# Patient Record
Sex: Male | Born: 1960 | Race: White | Hispanic: No | Marital: Single | Smoking: Never smoker
Health system: Southern US, Community
[De-identification: ages and names within clinical notes are randomized; demographics above are authoritative.]

## PROBLEM LIST (undated history)

## (undated) DIAGNOSIS — I10 Essential (primary) hypertension: Secondary | ICD-10-CM

---

## 2000-02-02 ENCOUNTER — Encounter: Payer: Self-pay | Admitting: Emergency Medicine

## 2000-02-02 ENCOUNTER — Emergency Department (HOSPITAL_COMMUNITY): Admission: EM | Admit: 2000-02-02 | Discharge: 2000-02-02 | Payer: Self-pay | Admitting: Emergency Medicine

## 2006-11-21 ENCOUNTER — Ambulatory Visit: Payer: Self-pay | Admitting: Internal Medicine

## 2013-12-25 ENCOUNTER — Ambulatory Visit (INDEPENDENT_AMBULATORY_CARE_PROVIDER_SITE_OTHER): Payer: Self-pay | Admitting: Podiatry

## 2013-12-25 ENCOUNTER — Ambulatory Visit: Payer: Self-pay | Admitting: Podiatry

## 2013-12-25 DIAGNOSIS — B351 Tinea unguium: Secondary | ICD-10-CM

## 2013-12-25 NOTE — Progress Notes (Signed)
Subjective:     Patient ID: Shane Rojas, male   DOB: 09-09-1961, 53 y.o.   MRN: [id number]  HPI patient states the nails still back and in his right big 1 has grown abnormally   Review of Systems     Objective:   Physical Exam Neurovascular status intact with thickness of the fifth nails bilateral and the right hallux medial border moving in a dorsal direction    Assessment:     Probable damage to the right hallux nail with mycosis of fifth nails    Plan:     Laser the fifth nails and debridement of the right hallux with discussion of removal of the corner if it does not get better

## 2015-01-09 ENCOUNTER — Other Ambulatory Visit: Payer: Self-pay | Admitting: Family Medicine

## 2015-01-09 ENCOUNTER — Ambulatory Visit
Admission: RE | Admit: 2015-01-09 | Discharge: 2015-01-09 | Disposition: A | Discharge status: Home / Self Care | Payer: 59 | Source: Ambulatory Visit | Attending: Family Medicine | Admitting: Family Medicine

## 2015-01-09 DIAGNOSIS — M25562 Pain in left knee: Secondary | ICD-10-CM

## 2015-01-14 ENCOUNTER — Ambulatory Visit: Payer: Self-pay | Admitting: Podiatry

## 2015-01-21 ENCOUNTER — Ambulatory Visit (INDEPENDENT_AMBULATORY_CARE_PROVIDER_SITE_OTHER): Payer: 59 | Admitting: Podiatry

## 2015-01-21 ENCOUNTER — Encounter: Payer: Self-pay | Admitting: Podiatry

## 2015-01-21 ENCOUNTER — Ambulatory Visit (INDEPENDENT_AMBULATORY_CARE_PROVIDER_SITE_OTHER): Payer: 59

## 2015-01-21 VITALS — BP 166/102 | HR 80 | Resp 14

## 2015-01-21 DIAGNOSIS — M79674 Pain in right toe(s): Secondary | ICD-10-CM

## 2015-01-21 DIAGNOSIS — M2041 Other hammer toe(s) (acquired), right foot: Secondary | ICD-10-CM

## 2015-01-21 NOTE — Progress Notes (Signed)
   Subjective:    Patient ID: Shane PollackJeffrey Rojas, male    DOB: [birth year], 54 y.o.   MRN: [id number]  HPI  Patient presents today with  right 2nd toe is red ,painful and swollen". It started 6 months ago, and has remain the same. He has been treating with apple cider soaks, but nothing works"   Review of Systems  All other systems reviewed and are negative.      Objective:   Physical Exam        Assessment & Plan:

## 2015-01-21 NOTE — Patient Instructions (Signed)
Pre-Operative Instructions  Congratulations, you have decided to take an important step to improving your quality of life.  You can be assured that the doctors of [facility] will be with you every step of the way.  1. Plan to be at the surgery center/hospital at least 1 (one) hour prior to your scheduled time unless otherwise directed by the surgical center/hospital staff.  You must have a responsible adult accompany you, remain during the surgery and drive you home.  Make sure you have directions to the surgical center/hospital and know how to get there on time. 2. For hospital based surgery you will need to obtain a history and physical form from your family physician within 1 month prior to the date of surgery- we will give you a form for you primary physician.  3. We make every effort to accommodate the date you request for surgery.  There are however, times where surgery dates or times have to be moved.  We will contact you as soon as possible if a change in schedule is required.   4. No Aspirin/Ibuprofen for one week before surgery.  If you are on aspirin, any non-steroidal anti-inflammatory medications (Mobic, Aleve, Ibuprofen) you should stop taking it 7 days prior to your surgery.  You make take Tylenol  For pain prior to surgery.  5. Medications- If you are taking daily heart and blood pressure medications, seizure, reflux, allergy, asthma, anxiety, pain or diabetes medications, make sure the surgery center/hospital is aware before the day of surgery so they may notify you which medications to take or avoid the day of surgery. 6. No food or drink after midnight the night before surgery unless directed otherwise by surgical center/hospital staff. 7. No alcoholic beverages 24 hours prior to surgery.  No smoking 24 hours prior to or 24 hours after surgery. 8. Wear loose pants or shorts- loose enough to fit over bandages, boots, and casts. 9. No slip on shoes, sneakers are best. 10. Bring  your boot with you to the surgery center/hospital.  Also bring crutches or a walker if your physician has prescribed it for you.  If you do not have this equipment, it will be provided for you after surgery. 11. If you have not been contracted by the surgery center/hospital by the day before your surgery, call to confirm the date and time of your surgery. 12. Leave-time from work may vary depending on the type of surgery you have.  Appropriate arrangements should be made prior to surgery with your employer. 13. Prescriptions will be provided immediately following surgery by your doctor.  Have these filled as soon as possible after surgery and take the medication as directed. 14. Remove nail polish on the operative foot. 15. Wash the night before surgery.  The night before surgery wash the foot and leg well with the antibacterial soap provided and water paying special attention to beneath the toenails and in between the toes.  Rinse thoroughly with water and dry well with a towel.  Perform this wash unless told not to do so by your physician.  Enclosed: 1 Ice pack (please put in freezer the night before surgery)   1 Hibiclens skin cleaner   Pre-op Instructions  If you have any questions regarding the instructions, do not hesitate to call our office.  [place]: [address] [place], [place] [address] [phone]  [place]: [address] [place], [place] [address] [phone]  [place]: [address]  [place], [place] [address] [phone]  Dr. Richard   Tuchman DPM, Dr. Norman Regal DPM Dr. Richard Sikora DPM, Dr. M. Todd Hyatt DPM, Dr. Kathryn Egerton DPM 

## 2015-01-23 NOTE — Progress Notes (Signed)
Subjective:     Patient ID: Shane Rojas, male   DOB: [birth year], 54 y.o.   MRN: [id number]  HPI patient presents stating my second toe right has been bother me for at least 6 months. 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 I did discuss treatment options and at this point it would probably be best to go ahead and do a small distal arthroplasty and do his best we can to remove the cyst even though it could possibly come back again in the future area I explained this versus trying to drain it which he has done numerous times over 6 months and he has opted to have it fixed. I allowed him to read a consent form for correction explaining all possible alternative treatments and complications listed and the fact that recovery and swelling of the toe can last for at least 6 months with this procedure and patient signed consent form understanding what we discussed. He will call to schedule his surgery depending on his schedule over the next few weeks and is encouraged to call with other questions

## 2015-02-02 ENCOUNTER — Telehealth: Payer: Self-pay | Admitting: *Deleted

## 2015-02-02 ENCOUNTER — Encounter: Payer: Self-pay | Admitting: Sports Medicine

## 2015-02-02 ENCOUNTER — Ambulatory Visit (INDEPENDENT_AMBULATORY_CARE_PROVIDER_SITE_OTHER): Payer: 59 | Admitting: Sports Medicine

## 2015-02-02 VITALS — BP 140/98 | HR 67 | Ht 73.0 in | Wt 200.0 lb

## 2015-02-02 DIAGNOSIS — M25562 Pain in left knee: Secondary | ICD-10-CM | POA: Diagnosis not present

## 2015-02-02 NOTE — Progress Notes (Signed)
   Subjective:    Patient ID: Shane PollackJeffrey Rojas, male    DOB: July 13, 1961, 54 y.o.   MRN: [id number]  HPI Patient is a 54 yo male presenting for evaluation of left knee pain. Gradual onset over the past 6 months. No injury. No prior surgeries. Pain is mostly in the anterior and lateral areas. There is mild intermittent swelling. Is an achey pain, that intermittent worsens. No popping, locking, or catching. Occasionally will feel as though it will give out. Was a runner for 35 years, though stopped running 9 years ago. Skier in the past. Currently swims and does yoga. Recently started tumeric and this has helped. Also uses makers mark and marijuana in the evenings for the pain. He is not interested in injections, other medications, or surgery. He would be interested in PT. Had XR recently that revealed medial compartment severe OA.   Review of Systems     Objective:   Physical Exam  Constitutional: He appears well-developed and well-nourished.  HENT:  Head: Normocephalic and atraumatic.  Musculoskeletal:  Left knee with no swelling or erythema, had mild "different sensation" when palpating the medial and lateral joint lines though no pain, no ligamentous laxity, negative mcmurrays, extremity warm Right knee with no swelling, erythema, or tenderness, no ligamentous laxity, negative mcmurrays, extremity warm 5/5 strength in bilateral quads, hamstrings, plantar and dorsiflexion, sensation to light touch intact in bilateral LE, normal gait, 2+ patellar reflexes       Assessment & Plan:  Left knee medial compartment OA  XR consistent with the above diagnosis. Discussed treatment options with patient including injection (patient declined), other medication management (patient declined), compression sleeve, and PT. Patient opted for compression sleeve and PT at this time. Also advised that he could add cycling, though to limit amount of up hill and out of saddle work he does if he has pain. Can  continue swimming and yoga. Can continue tumeric and advised to not use other OTC NSAIDs while using tumeric. Prescription given for PT. Follow-up prn.   Marikay AlarEric Akeisha Lagerquist, MD [facility] PGY3  Patient seen and evaluated with the resident. I agree with above plan of care. X-rays of his left knee show advanced medial compartmental DJD. We will start with physical therapy and a compression sleeve. Patient understands that we can pursue cortisone injections down the road if his symptoms warrant. Follow-up when necessary.

## 2015-02-02 NOTE — Telephone Encounter (Signed)
"  Dr. Charlsie Merlesegal wanted me to call you to schedule my surgery.  I'm looking for something after 02/25/2015."  He can do it on 03/03/2015 if you like.  "That'll be perfect.  Thank you."  The surgical center will call you a day or two prior to surgery date and give you the exact arrival time.

## 2015-02-20 ENCOUNTER — Telehealth: Payer: Self-pay | Admitting: *Deleted

## 2015-02-20 NOTE — Telephone Encounter (Signed)
I may be able to fix in office. Let me see last chart note

## 2015-02-20 NOTE — Telephone Encounter (Signed)
"  You have helped me by scheduling a surgery for me on 03/03/2015.  I need to know the code to use to see how much it will cost and determine if my insurance will cover it."  I'm returning your call.  The code you need to use is 28285.  The procedure itself costs about $900.  You'll be responsible for 20%.  "I need to cancel for right now because this is just your fee correct?"  Yes, there will be an anesthesia fee as well as a facility fee.  You can make payment arrangements with [place].  "I have a lot of bills that I'm taking care of right now and I can't afford to add on to them.  I'll just hold off until next fall.  I thank you so much for your help.  Do I need to cancel with the surgical center or is that something that you will take care of?"  I'll call the surgical center and cancel it.  "Thank you very much."  I called and canceled it at [facility].

## 2015-02-24 ENCOUNTER — Telehealth: Payer: Self-pay | Admitting: *Deleted

## 2015-02-24 NOTE — Telephone Encounter (Signed)
Per Dr. Charlsie Merles, I called and informed patient that Dr. Charlsie Merles said he can do your surgery here in the office.  That way, you can omit having a facility and anesthesia fee.  "So, I will be responsible for 20% of $900.  That's something to consider.  I appreciate him looking out for me.  Let me check my schedule and some other things and get back to you."

## 2015-03-03 ENCOUNTER — Other Ambulatory Visit: Payer: 59

## 2015-12-22 ENCOUNTER — Telehealth: Payer: Self-pay | Admitting: *Deleted

## 2015-12-22 NOTE — Telephone Encounter (Signed)
Patient called stating that his toenail is not doing any better since having the laser treatment and is requesting an oral antifungal. He does not want to come in for an appointment. Can we call him something in? Please advise.

## 2015-12-23 MED ORDER — TERBINAFINE HCL 250 MG PO TABS: ORAL_TABLET | ORAL | Status: DC

## 2015-12-23 NOTE — Telephone Encounter (Signed)
He can have a pulse lamisil treatment

## 2015-12-23 NOTE — Telephone Encounter (Signed)
Dr. Charlsie Merlesegal ordered 4 month pulses system of Terbinafine 250mg .  Orders to [facility] [address], and instructions to pt.

## 2016-09-26 IMAGING — CR DG KNEE COMPLETE 4+V*L*
4 series · 4 of 4 positions shown · non-contrast
Comparison: None.

CLINICAL DATA: Increasing left knee pain for the past 6 months;
painful range of motion, no known trauma.

EXAM:
LEFT KNEE - COMPLETE 4+ VIEW

[w knee ap left]
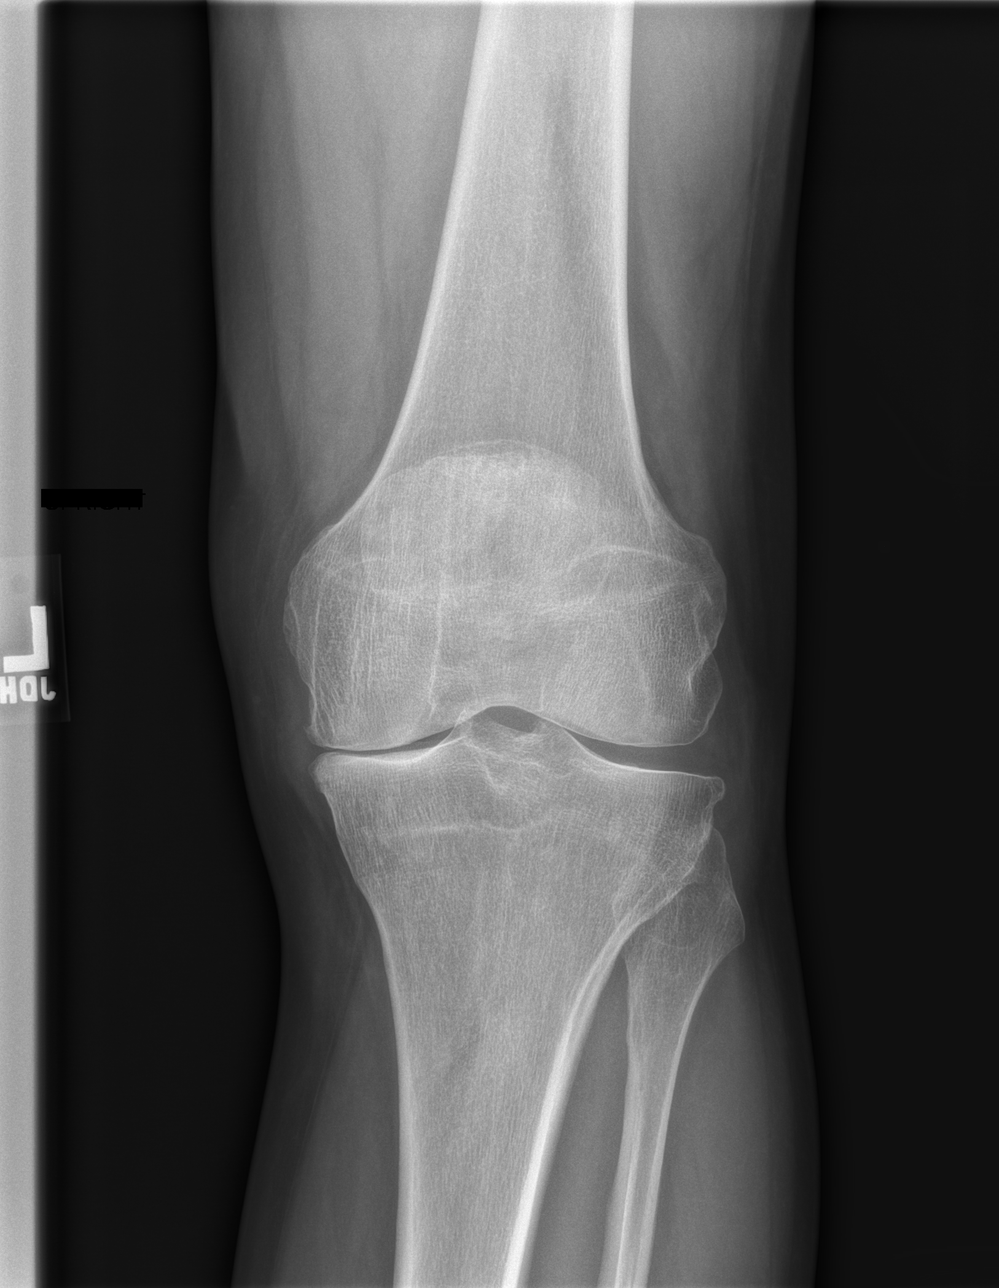

[w knee lat left]
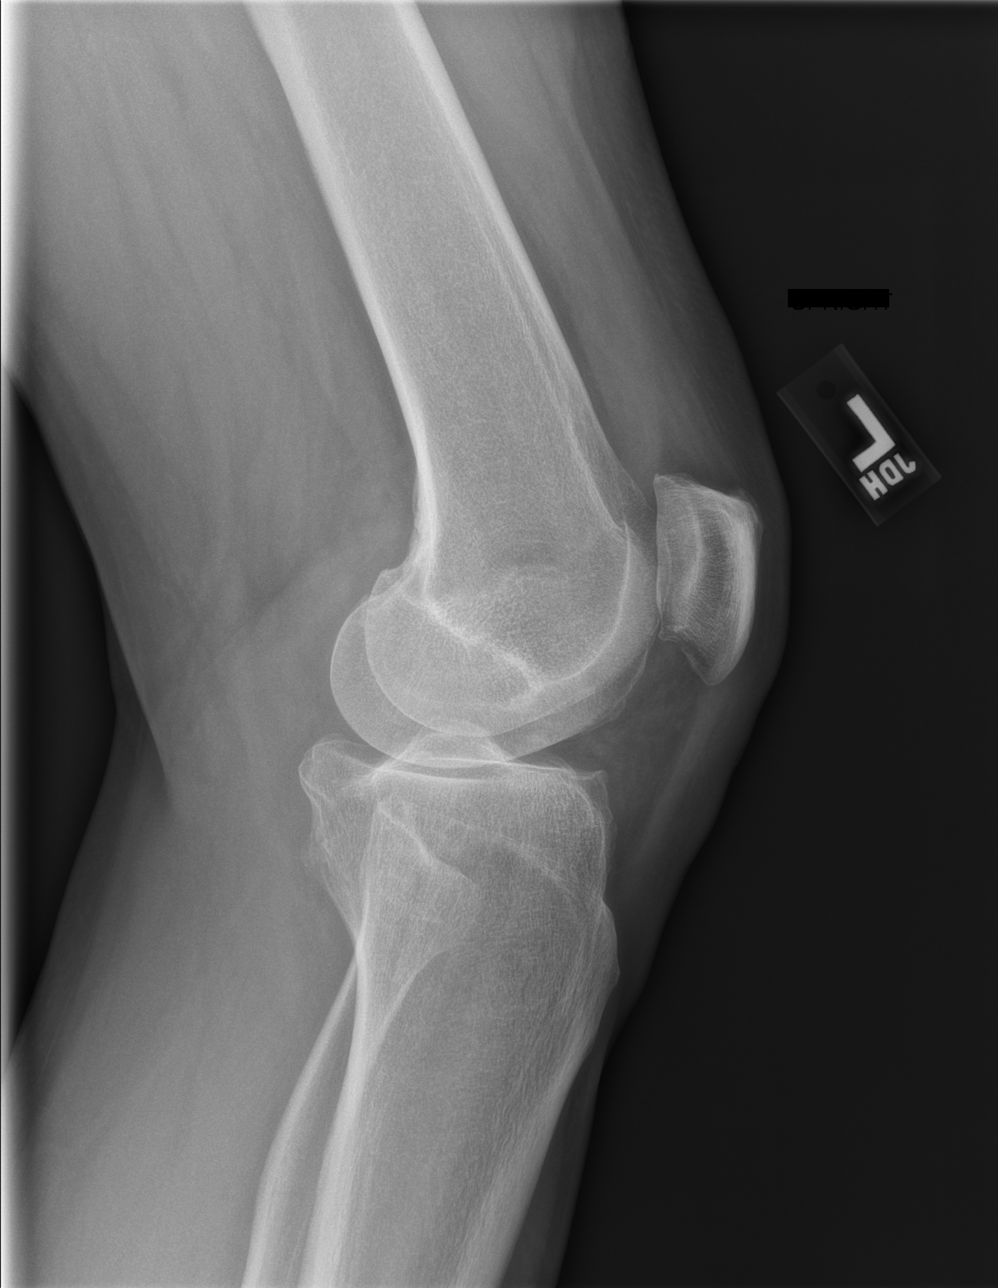

[w knee tunnel ap left]
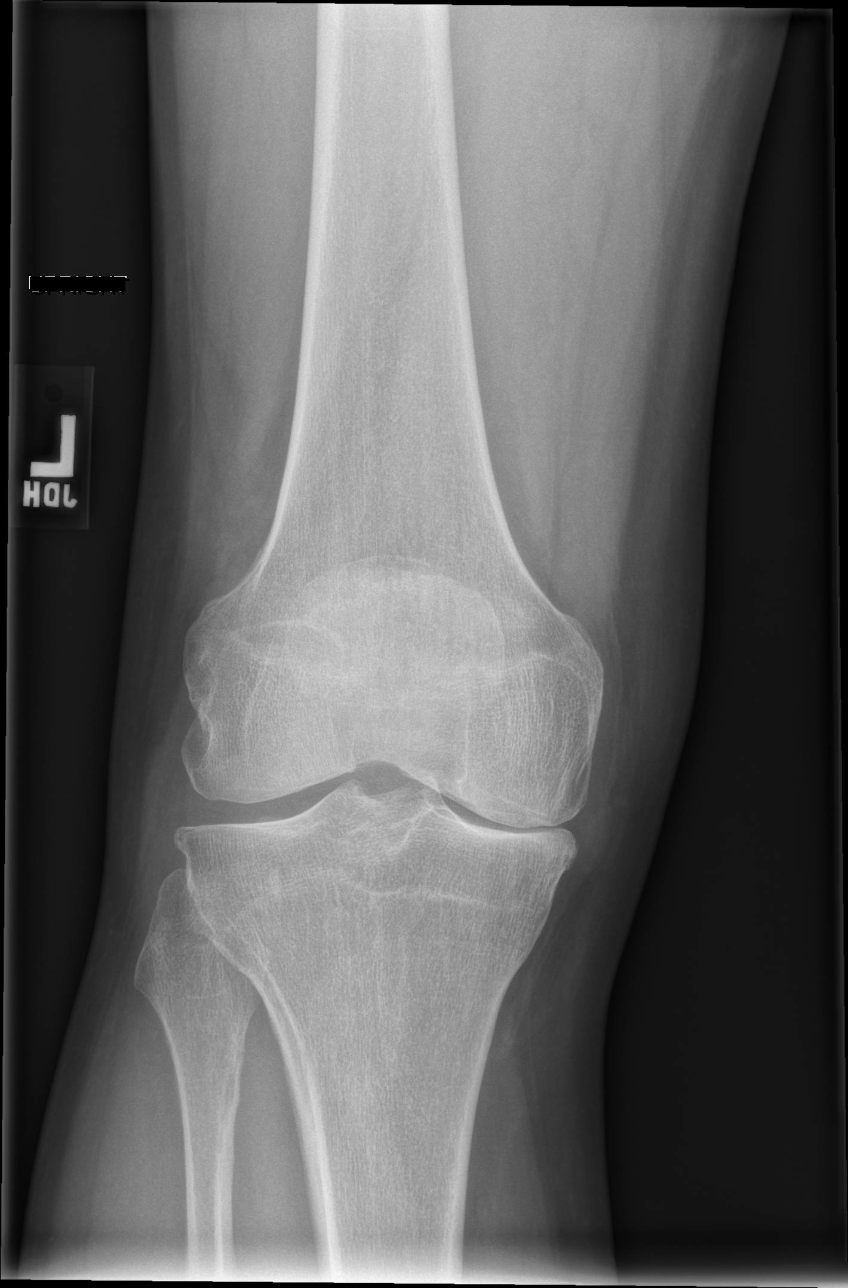

[x knee ap left]
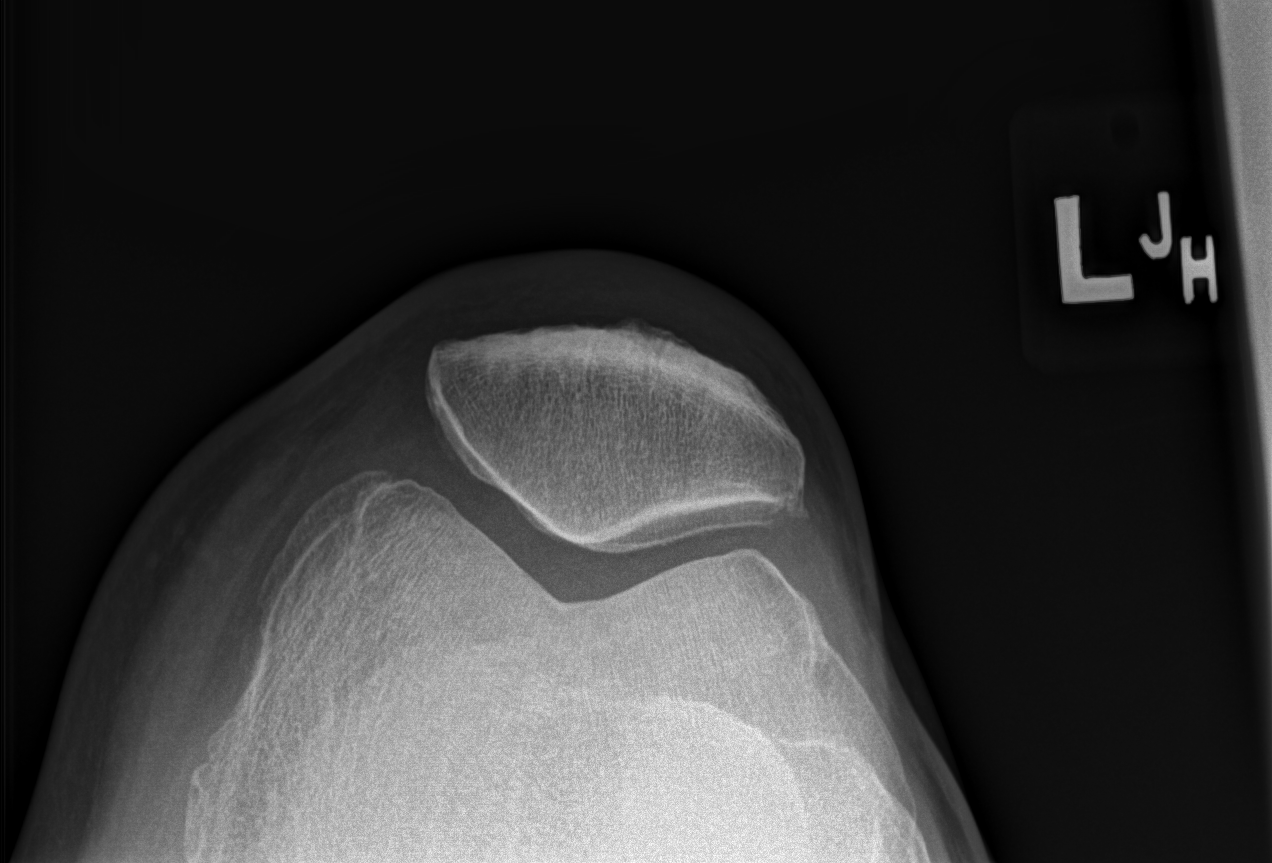

[4 of 4 positions shown; findings below may reference images not displayed]

FINDINGS: The bones of the left knee are adequately mineralized. There is mild
narrowing of the medial joint compartment. There is beaking of the
tibial spines. There tiny spurs from the superior and inferior
articular margins of the patella. There is no joint effusion.
IMPRESSION: There is moderate narrowing of the medial joint compartment and
minimal spurring of the articular surface of the patella consistent
with osteoarthritis. There is no acute bony abnormality.

## 2019-06-19 ENCOUNTER — Other Ambulatory Visit: Payer: Self-pay

## 2019-06-19 ENCOUNTER — Encounter: Payer: Self-pay | Admitting: Podiatry

## 2019-06-19 ENCOUNTER — Ambulatory Visit (INDEPENDENT_AMBULATORY_CARE_PROVIDER_SITE_OTHER): Payer: Self-pay | Admitting: Podiatry

## 2019-06-19 VITALS — BP 156/84

## 2019-06-19 DIAGNOSIS — B351 Tinea unguium: Secondary | ICD-10-CM

## 2019-06-19 MED ORDER — TERBINAFINE HCL 250 MG PO TABS: ORAL_TABLET | ORAL | 1 refills | Status: DC

## 2019-06-23 NOTE — Progress Notes (Signed)
Subjective:   Patient ID: Shane Rojas, male   DOB: 58 y.o.   MRN: [id number]   HPI Patient states his nails are starting to discolored again and he did well when he took the antifungal   ROS      Objective:  Physical Exam  Neurovascular status intact with patient shown to have discoloration hallux nail left over right localized in nature     Assessment:  Localized nail infection with probable trauma also present     Plan:  Reviewed condition discussed treatment options and we will go with anti-fungal oral consisting of pulse pack and I did review laser or other treatments that are possible

## 2020-02-26 ENCOUNTER — Ambulatory Visit: Payer: Self-pay

## 2020-02-26 ENCOUNTER — Encounter: Payer: Self-pay | Admitting: Family Medicine

## 2020-02-26 ENCOUNTER — Other Ambulatory Visit: Payer: Self-pay

## 2020-02-26 ENCOUNTER — Ambulatory Visit: Payer: Self-pay | Admitting: Family Medicine

## 2020-02-26 DIAGNOSIS — M542 Cervicalgia: Secondary | ICD-10-CM

## 2020-02-26 DIAGNOSIS — M79602 Pain in left arm: Secondary | ICD-10-CM

## 2020-02-26 NOTE — Progress Notes (Signed)
Office Visit Note   Patient: Shane Rojas           Date of Birth: 1961-08-16           MRN: [id number] Visit Date: 02/26/2020 Requested by: Tally Joe, MD [phone] [address] [place],  [place] [address] PCP: Tally Joe, MD  Subjective: Chief Complaint  Patient presents with  . Left Shoulder - Pain  . Neck - Pain    HPI: He is here with neck and left shoulder pain. He states that 11 years ago he herniated a cervical disc. He underwent chiropractic treatment and eventually was able to manage his pain without needing surgery. Over the years he has had intermittent flareups of pain. Most recently a couple weeks ago he started hurting again and this time he developed weakness in his arm. He is right-hand dominant. He has difficulty picking things up because of the weakness. He began seeing his chiropractor and is also doing yoga. The chiropractic treatment has helped with his pain but is weakness has persisted. Denies fevers or chills, he gets intermittent numbness in his hand. He is otherwise been in good health. His primary concern is to rule out cancer. He has never had cancer but it is thought has crossed his mind.  He works as an Insurance underwriter.               ROS:   All other systems were reviewed and are negative.  Objective: Vital Signs: There were no vitals taken for this visit.  Physical Exam:  General:  Alert and oriented, in no acute distress. Pulm:  Breathing unlabored. Psy:  Normal mood, congruent affect. Skin: No rash Neck: He has good range of motion today, some pain at the extremes. Spurling's test is negative. He has weakness with deltoid, biceps flexion on the left. Rotator cuff is weak with empty can and external rotation. The weakness in those muscles is 4/5. Internal rotation, triceps, wrist and intrinsic hand strength are all 5/5. He has 2+ biceps, triceps, brachial radialis DTRs bilaterally.  Imaging: XR Cervical Spine 2 or 3  views  Result Date: 02/26/2020 X-ray cervical spine reveal diffuse degenerative disc disease and facet and uncovertebral DJD. I do not see any compression fracture or any signs of neoplasm.  XR Shoulder Left  Result Date: 02/26/2020 X-rays left shoulder reveal mild to moderate AC joint DJD. Type I-2 acromion. Glenohumeral joint looks normal. No soft tissue calcifications. No sign of neoplasm. Lung fields are clear.   Assessment & Plan: 1. 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 -He did not want anything for pain. He plans to continue working with his chiropractor. If after 2 to 3 months his strength is not returning, he will call me and I will order cervical spine MRI scan. Nerve conduction studies would be another consideration prior to MRI.     Procedures: No procedures performed  No notes on file     PMFS History: There are no problems to display for this patient.  History reviewed. No pertinent past medical history.  History reviewed. No pertinent family history.  History reviewed. No pertinent surgical history. Social History   Occupational History  . Not on file  Tobacco Use  . Smoking status: Never Smoker  . Smokeless tobacco: Never Used  Substance and Sexual Activity  . Alcohol use: Yes    Alcohol/week: 0.0 standard drinks    Comment:  daily  . Drug use: Yes    Types: Marijuana  . Sexual activity: Not on file

## 2021-02-17 ENCOUNTER — Telehealth: Payer: Self-pay | Admitting: *Deleted

## 2021-02-17 NOTE — Telephone Encounter (Signed)
Will continue to cause change for approx 6 months after stopping medicine

## 2021-02-17 NOTE — Telephone Encounter (Signed)
Patient is calling to request a refill of terbinafine-250 mg and what will be the next phase for his toes, medication didn't cure it yet. Please advise.

## 2021-02-18 ENCOUNTER — Telehealth: Payer: Self-pay | Admitting: *Deleted

## 2021-02-18 NOTE — Telephone Encounter (Signed)
Returned call to patient, gave information to patient, wanted to scheduled an appointment for a follow-up.Please schedule.

## 2021-02-18 NOTE — Telephone Encounter (Signed)
Error message

## 2021-02-24 ENCOUNTER — Ambulatory Visit (INDEPENDENT_AMBULATORY_CARE_PROVIDER_SITE_OTHER): Payer: Self-pay | Admitting: Podiatry

## 2021-02-24 ENCOUNTER — Other Ambulatory Visit: Payer: Self-pay

## 2021-02-24 DIAGNOSIS — B351 Tinea unguium: Secondary | ICD-10-CM

## 2021-02-24 MED ORDER — TERBINAFINE HCL 250 MG PO TABS: ORAL_TABLET | ORAL | 0 refills | Status: DC

## 2021-02-24 NOTE — Progress Notes (Signed)
Subjective:   Patient ID: Shane Rojas, male   DOB: 60 y.o.   MRN: [id number]   HPI Patient presents concerned about his nail disease of both feet states that he thinks it is gotten worse again and he does not remember whether he has had significant improvement.  States the oral medicine seem to help him   ROS      Objective:  Physical Exam  Neurovascular status intact with discoloration of the hallux and fifth nails bilateral local     Assessment:  Probability for local mycotic nail infection     Plan:  Reviewed condition and recommended oral pulse Lamisil therapy and he is placed on this today and will be seen back for review

## 2022-08-25 ENCOUNTER — Other Ambulatory Visit: Payer: Self-pay

## 2022-08-25 ENCOUNTER — Encounter (HOSPITAL_COMMUNITY): Payer: Self-pay

## 2022-08-25 ENCOUNTER — Observation Stay (HOSPITAL_COMMUNITY)
Admission: EM | Admit: 2022-08-25 | Discharge: 2022-08-26 | Disposition: A | Discharge status: Home / Self Care | Payer: Self-pay | Attending: Internal Medicine | Admitting: Internal Medicine

## 2022-08-25 ENCOUNTER — Emergency Department (HOSPITAL_COMMUNITY): Payer: Self-pay

## 2022-08-25 DIAGNOSIS — Z79899 Other long term (current) drug therapy: Secondary | ICD-10-CM | POA: Insufficient documentation

## 2022-08-25 DIAGNOSIS — I351 Nonrheumatic aortic (valve) insufficiency: Secondary | ICD-10-CM | POA: Insufficient documentation

## 2022-08-25 DIAGNOSIS — R001 Bradycardia, unspecified: Principal | ICD-10-CM | POA: Diagnosis present

## 2022-08-25 DIAGNOSIS — R002 Palpitations: Secondary | ICD-10-CM | POA: Insufficient documentation

## 2022-08-25 DIAGNOSIS — I160 Hypertensive urgency: Secondary | ICD-10-CM

## 2022-08-25 DIAGNOSIS — R0789 Other chest pain: Secondary | ICD-10-CM | POA: Insufficient documentation

## 2022-08-25 DIAGNOSIS — I10 Essential (primary) hypertension: Secondary | ICD-10-CM | POA: Insufficient documentation

## 2022-08-25 HISTORY — DX: Essential (primary) hypertension: I10

## 2022-08-25 LAB — CBC
HCT: 40.5 % (ref 39.0–52.0)
HCT: 36.6 % — ABNORMAL LOW (ref 39.0–52.0)
Hemoglobin: 14.2 g/dL (ref 13.0–17.0)
Hemoglobin: 12.7 g/dL — ABNORMAL LOW (ref 13.0–17.0)
MCH: 31.3 pg (ref 26.0–34.0)
MCH: 31.7 pg (ref 26.0–34.0)
MCHC: 35.1 g/dL (ref 30.0–36.0)
MCHC: 34.7 g/dL (ref 30.0–36.0)
MCV: 89.4 fL (ref 80.0–100.0)
MCV: 91.3 fL (ref 80.0–100.0)
Platelets: 150 10*3/uL (ref 150–400)
Platelets: 131 10*3/uL — ABNORMAL LOW (ref 150–400)
RBC: 4.53 MIL/uL (ref 4.22–5.81)
RBC: 4.01 MIL/uL — ABNORMAL LOW (ref 4.22–5.81)
RDW: 12.4 % (ref 11.5–15.5)
RDW: 12.4 % (ref 11.5–15.5)
WBC: 5.8 10*3/uL (ref 4.0–10.5)
WBC: 5.7 10*3/uL (ref 4.0–10.5)
nRBC: 0 % (ref 0.0–0.2)
nRBC: 0 % (ref 0.0–0.2)

## 2022-08-25 LAB — BASIC METABOLIC PANEL
Anion gap: 10 (ref 5–15)
BUN: 12 mg/dL (ref 8–23)
CO2: 22 mmol/L (ref 22–32)
Calcium: 9 mg/dL (ref 8.9–10.3)
Chloride: 103 mmol/L (ref 98–111)
Creatinine, Ser: 0.95 mg/dL (ref 0.61–1.24)
GFR, Estimated: >60 mL/min (ref >60)
Glucose, Bld: 109 mg/dL — ABNORMAL HIGH (ref 70–99)
Potassium: 3.5 mmol/L (ref 3.5–5.1)
Sodium: 135 mmol/L (ref 135–145)

## 2022-08-25 LAB — MAGNESIUM: Magnesium: 2.1 mg/dL (ref 1.7–2.4)

## 2022-08-25 LAB — CREATININE, SERUM
Creatinine, Ser: 0.81 mg/dL (ref 0.61–1.24)
GFR, Estimated: >60 mL/min (ref >60)

## 2022-08-25 LAB — T4, FREE: Free T4: 0.96 ng/dL (ref 0.61–1.12)

## 2022-08-25 LAB — TROPONIN I (HIGH SENSITIVITY)
Troponin I (High Sensitivity): 10 ng/L (ref <18)
Troponin I (High Sensitivity): 14 ng/L (ref <18)

## 2022-08-25 LAB — CBG MONITORING, ED: Glucose-Capillary: 98 mg/dL (ref 70–99)

## 2022-08-25 LAB — TSH: TSH: 1.326 u[IU]/mL (ref 0.350–4.500)

## 2022-08-25 MED ORDER — SODIUM CHLORIDE 0.9% FLUSH: 3.0000 mL | INTRAVENOUS | Status: DC | PRN

## 2022-08-25 MED ORDER — ASPIRIN 81 MG PO CHEW
324.0000 mg | CHEWABLE_TABLET | Freq: Once | ORAL | Status: AC
  Administered 2022-08-25: 324 mg via ORAL
  Filled 2022-08-25: qty 4

## 2022-08-25 MED ORDER — SODIUM CHLORIDE 0.9 % IV SOLN: 250.0000 mL | INTRAVENOUS | Status: DC | PRN

## 2022-08-25 MED ORDER — SODIUM CHLORIDE 0.9% FLUSH
3.0000 mL | Freq: Two times a day (BID) | INTRAVENOUS | Status: DC
  Administered 2022-08-25 – 2022-08-26 (×2): 3 mL via INTRAVENOUS

## 2022-08-25 MED ORDER — NITROGLYCERIN 0.4 MG SL SUBL: 0.4000 mg | SUBLINGUAL_TABLET | SUBLINGUAL | Status: DC | PRN

## 2022-08-25 MED ORDER — ASPIRIN 81 MG PO TBEC
81.0000 mg | DELAYED_RELEASE_TABLET | Freq: Every day | ORAL | Status: DC
  Administered 2022-08-26: 81 mg via ORAL
  Filled 2022-08-25: qty 1

## 2022-08-25 MED ORDER — LISINOPRIL 10 MG PO TABS
10.0000 mg | ORAL_TABLET | Freq: Every day | ORAL | Status: DC
  Administered 2022-08-25 – 2022-08-26 (×2): 10 mg via ORAL
  Filled 2022-08-25 (×2): qty 1

## 2022-08-25 MED ORDER — HEPARIN SODIUM (PORCINE) 5000 UNIT/ML IJ SOLN
5000.0000 [IU] | Freq: Three times a day (TID) | INTRAMUSCULAR | Status: DC
  Filled 2022-08-25: qty 1

## 2022-08-25 MED ORDER — POTASSIUM CHLORIDE CRYS ER 20 MEQ PO TBCR
40.0000 meq | EXTENDED_RELEASE_TABLET | Freq: Once | ORAL | Status: AC
  Administered 2022-08-25: 40 meq via ORAL
  Filled 2022-08-25: qty 2

## 2022-08-25 MED ORDER — ONDANSETRON HCL 4 MG/2ML IJ SOLN: 4.0000 mg | Freq: Four times a day (QID) | INTRAMUSCULAR | Status: DC | PRN

## 2022-08-25 MED ORDER — ACETAMINOPHEN 325 MG PO TABS: 650.0000 mg | ORAL_TABLET | ORAL | Status: DC | PRN

## 2022-08-25 NOTE — ED Provider Notes (Signed)
[facility] EMERGENCY DEPARTMENT Provider Note   CSN: [id number] Arrival date & time: 08/25/22  1308     History  Chief Complaint  Patient presents with   Chest Pain    Shane Rojas is a 61 y.o. male with past medical history significant for hypertension presenting via EMS from urgent care for chest pain and bradycardia.  Patient states that over the past few weeks he has had an increased sensation of his heart and heart beat.  He also endorses some intermittent chest heaviness.  He states today he had a "strange sensation" in his left arm which prompted him to go to urgent care.  He came to [place] from urgent care.  Unclear why urgent care called EMS.  Per EMS on their arrival, patient was hypertensive with heart rate in the 70s.  They stated that while they were in rounds, patient became bradycardic to the 30s.  Blood pressure was in the 160s systolic.  Patient endorsed chest discomfort during this time.  They gave him atropine.  They report improvement in heart rate to 50s.  They began pacing him.  He had no change in his blood pressure throughout.  He did not lose consciousness.  Patient states that he has been trying to manage his blood pressure with diet, medication, and has not been taking his lisinopril.  He denies nausea, vomiting, fevers, cough, congestion.     Home Medications Prior to Admission medications   Medication Sig Start Date End Date Taking? Authorizing Provider  lisinopril (ZESTRIL) 5 MG tablet Take 5 mg by mouth daily.   Yes [provider]  terbinafine (LAMISIL) 250 MG tablet One tablet daily for 7 days, off 3 weeks as one pulse, repeat pulse 3 more times. Patient not taking: Reported on 08/25/2022 12/23/15   Lenn Sink, DPM  terbinafine (LAMISIL) 250 MG tablet Please take one a day x 7days, repeat every 4 weeks x 4 months 06/19/19   Lenn Sink, DPM  terbinafine (LAMISIL) 250 MG tablet Please take one a day x 7days, repeat every 4  weeks x 4 months Patient not taking: Reported on 08/25/2022 02/24/21   Lenn Sink, DPM      Allergies    Patient has no known allergies.    Review of Systems   Review of Systems  Cardiovascular:  Positive for chest pain.    Physical Exam Updated Vital Signs BP (!) 170/94   Pulse 66   Temp 98.6 F (37 C)   Resp 15   Ht 6\' 2"  (1.88 m)   Wt 88.5 kg   SpO2 100%   BMI 25.04 kg/m  Physical Exam Constitutional:      General: He is not in acute distress.    Appearance: He is not ill-appearing or diaphoretic.  HENT:     Head: Normocephalic.  Cardiovascular:     Rate and Rhythm: Normal rate and regular rhythm.     Heart sounds: Normal heart sounds.  Pulmonary:     Effort: Pulmonary effort is normal.     Breath sounds: Normal breath sounds.  Abdominal:     Palpations: Abdomen is soft.     Tenderness: There is no abdominal tenderness. There is no guarding or rebound.  Musculoskeletal:     Right lower leg: No tenderness. No edema.     Left lower leg: No tenderness. No edema.  Neurological:     Mental Status: He is alert and oriented to person, place, and time.  Psychiatric:        Mood and Affect: Mood is anxious.        Behavior: Behavior is not agitated.     ED Results / Procedures / Treatments   Labs (all labs ordered are listed, but only abnormal results are displayed) Labs Reviewed  BASIC METABOLIC PANEL - Abnormal; Notable for the following components:      Result Value   Glucose, Bld 109 (*)    All other components within normal limits  CBC  MAGNESIUM  TSH  T4, FREE  CBG MONITORING, ED  TROPONIN I (HIGH SENSITIVITY)  TROPONIN I (HIGH SENSITIVITY)    EKG EKG Interpretation  Date/Time:  Thursday August 25 2022 13:16:50 EST Ventricular Rate:  71 PR Interval:  174 QRS Duration: 137 QT Interval:  427 QTC Calculation: 464 R Axis:   33 Text Interpretation: Sinus rhythm Right bundle branch block Confirmed by Virgina Norfolk ([id number]) on 08/25/2022  1:18:44 PM  Radiology DG Chest Portable 1 View  Result Date: 08/25/2022 CLINICAL DATA:  Chest pain. EXAM: PORTABLE CHEST 1 VIEW COMPARISON:  None Available. FINDINGS: The heart size and mediastinal contours are within normal limits. Both lungs are clear. The visualized skeletal structures are unremarkable. IMPRESSION: No active disease. Electronically Signed   By: Lupita Raider M.D.   On: 08/25/2022 13:40    Procedures Procedures    Medications Ordered in ED Medications  aspirin chewable tablet 324 mg (324 mg Oral Given 08/25/22 1338)    ED Course/ Medical Decision Making/ A&P                           Medical Decision Making Amount and/or Complexity of Data Reviewed Labs: ordered. Decision-making details documented in ED Course. Radiology: ordered and independent interpretation performed. Decision-making details documented in ED Course. ECG/medicine tests: ordered and independent interpretation performed. Decision-making details documented in ED Course.   On initial evaluation, patient had pacing pads on.  Upon discontinuation of pacing, his heart rate was in the 70s, he continued to be alert, oriented, hypertensive.  He denies chest pain at this time.  He expresses feeling anxious and worried.  Differential at this time includes arrhythmia, heart block, ACS.  Patient placed on the cardiac monitor.    EKG showed sinus rhythm, ventricular rate 71 bpm, with PACs.  No ST elevations, no ST depressions, no acute ischemic signs.  He does have right bundle branch block.  No prior EKG for comparison.  CBC, BMP, troponin, TSH, free T4, magnesium ordered.  Chest x-ray showed no focal consolidation or opacification.  BMP unremarkable.  CBC within normal limits.  Initial troponin 10.  Plan at time of signout to follow-up patient's troponin and continue to observe on telemetry.  Should patient remain in a normal sinus rhythm, have improvement in his symptoms, have normal troponin, TSH,  free T4, I believe he will be stable for discharge with outpatient follow-up with cardiology.        Final Clinical Impression(s) / ED Diagnoses Final diagnoses:  Chest heaviness  Palpitations    Rx / DC Orders ED Discharge Orders     None         Linward Foster, MD 08/25/22 1533    Virgina Norfolk, DO 08/26/22 3709

## 2022-08-25 NOTE — ED Provider Notes (Signed)
Supervised resident visit.  Patient here with palpitations.  Patient has been having a weird feeling in his body being very aware of his heart rate.  He has been trying to use yoga stress reduction to help with blood pressure that is elevated and not been taking his lisinopril.  He denies any active chest pain or shortness of breath.  Denies any alcohol or drug use.  He has a history of high blood pressure that he does not take his lisinopril because he does not like to take medicines.  There is may be some bradycardic episodes with EMS however rhythm strips EMS have provided shows sinus rhythm.  No ischemic changes.  No obvious bradycardias.  He is having some PVCs/PACs.  He was given atropine in route and paced briefly.  But he arrives here with normal vitals.  Heart rate in the 70s.  He has normal sinus rhythm with no ischemic changes on EKG here.  I see what looks like a PAC/PVC.  I suspect that these are what he is feeling as he is mostly describing some intermittent palpitations.  He does not really have cardiac risk factors except for hypertension.  He has no PE risk factors.  He has no infectious symptoms.  Denies any GI symptoms or viral symptoms.  Will check CBC, BMP, troponin, chest x-ray, electrolytes, thyroid studies and magnesium.  Will monitor his heart rhythm while he is here.  He has not had any syncopal episodes or loss of consciousness.  Anticipate discharge to home if workup is unremarkable with him starting his blood pressure medicine and following up with his primary care doctor.  This chart was dictated using voice recognition software.  Despite best efforts to proofread,  errors can occur which can change the documentation meaning.    Virgina Norfolk, DO 08/25/22 1355

## 2022-08-25 NOTE — ED Notes (Signed)
Report received, patient moved to ER 12, assumed care of patient at this time.

## 2022-08-25 NOTE — ED Triage Notes (Signed)
Pt BIB [facility] from urgent care c/o bradycardia and a weird felling in his arm and some CP this morning which is what took him to urgent care. Pt has a hx of HTN. Pt was 70 enroute and then dropped to the 40's and then the 30's . EMS started pacing pt and gave 1mg  of atropine.

## 2022-08-25 NOTE — ED Provider Notes (Signed)
  Physical Exam  BP (!) 170/94   Pulse 66   Temp 98.6 F (37 C)   Resp 15   Ht 6\' 2"  (1.88 m)   Wt 88.5 kg   SpO2 100%   BMI 25.04 kg/m   Physical Exam Vitals and nursing note reviewed.  Constitutional:      General: He is not in acute distress.    Appearance: He is well-developed.  HENT:     Head: Normocephalic and atraumatic.  Eyes:     Conjunctiva/sclera: Conjunctivae normal.  Cardiovascular:     Rate and Rhythm: Normal rate and regular rhythm.     Heart sounds: No murmur heard. Pulmonary:     Effort: Pulmonary effort is normal. No respiratory distress.     Breath sounds: Normal breath sounds.  Abdominal:     Palpations: Abdomen is soft.     Tenderness: There is no abdominal tenderness.  Musculoskeletal:        General: No swelling.     Cervical back: Neck supple.  Skin:    General: Skin is warm and dry.     Capillary Refill: Capillary refill takes less than 2 seconds.  Neurological:     General: No focal deficit present.     Mental Status: He is alert and oriented to person, place, and time. Mental status is at baseline.     Cranial Nerves: No cranial nerve deficit.     Sensory: No sensory deficit.     Motor: No weakness.     Coordination: Coordination normal.  Psychiatric:        Mood and Affect: Mood normal.     Procedures  Procedures  ED Course / MDM    Medical Decision Making Amount and/or Complexity of Data Reviewed Labs: ordered. Radiology: ordered.  Risk OTC drugs. Decision regarding hospitalization.   Patient's presentation is most consistent with acute presentation with potential threat to life or bodily function.   The patient is a 61 year old male with past medical history of uncontrolled hypertension presenting by EMS from urgent care.  The patient states that he has known that he has hypertension for some time.  He endorses several weeks of feeling palpitations.  He went to an urgent care where he was noted to be hypertensive and with  systolic blood pressures in the 220s+.  The urgent care provider wanted the patient to be seen in the emergency department and called an ambulance.  While en route to the hospital the patient stated that he began to feel ill and bradycardia down to a heart rate of 30.  He believes he had an associated loss of consciousness.  The patient was given a dose of atropine and pacing was initiated.  The patient's workup with the previous provider included serial troponins which were negative; T4, mag, blood glucose, BMP, and CBC all of which were unremarkable.  Cardiology was consulted and the patient was admitted for observation.  There were no additional episodes of bradycardia or syncope while in the emergency department.     67, MD 08/25/22 2006    [place], 08/27/22, MD 08/26/22 0001

## 2022-08-25 NOTE — Discharge Instructions (Addendum)
START Lisinopril 10mg  once daily. Follow-up with Cardiology as directed.

## 2022-08-25 NOTE — H&P (Addendum)
Cardiology Admission History and Physical   Patient ID: Shane Rojas MRN: [id number]; DOB: 1960-10-23   Admission date: 08/25/2022  PCP:  Pcp, No   [facility] Providers Cardiologist:  None        Chief Complaint:  weird feeling in chest  Patient Profile:   Shane Rojas is a 61 y.o. male with HTN who is being seen 08/25/2022 for the evaluation of bradycardia, odd feeling in chest.  History of Present Illness:   [name] reports a prior hx of borderline HTN. He previously was suggested to start lisinopril about a year ago but stopped taking it as he was trying to manage this with diet and exercise. 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 He also goes to the gym regularly and does yoga. He has not had any recent angina or DOE. He does note a profound history of vagal reactions to getting blood drawn in the past to the point where he would warn the person drawing his blood that they would need to do so with him lying down otherwise he would pass out. He has not had this happen in several years as he has not sought medical care otherwise due to lack of insurance, aside from the encounter where lisinopril was added. He drinks average 1 beer 3x week, sometimes a second drink on these occasions. + THC infrequently, no illicit drug use.  About 3 weeks ago he began to feel an unusual sensation in his chest described as though his heart was occupying more space than it typically did. He also would intermittently feel an awareness of his left arm. No overt chest pain, left arm pain, numbness, tingling. No facial asymmetry, focal weakness, speech difficulty or other stroke symptoms. The symptoms would come and go, waxing/waning for a day at a time, then disappear for days, not made any better or worse by anything. He went to urgent care where he was found to be markedly hypertensive despite rechecking twice. He was subsequently  sent by EMS to the hospital. He had an IV started and began to get IV fluids. He notified the EMS driver that he did not feel well all of a sudden and was reported to be bradycardic. This was totally different than his presenting symptoms. The report we received is that he got atropine in route and was paced briefly. He felt near-syncopal with this. Symptoms recovered quickly and he was in NSR with RBBB on arrival here. No prior to compare to from prior years. There are strips at bedside but they did not capture the bradycardia. He continues to have some general awareness of his chest wall but states he is not sure if perhaps that is the EKG leads on his chest.    Past Medical History:  Diagnosis Date   Hypertension     History reviewed. No pertinent surgical history.   Medications Prior to Admission: Prior to Admission medications   Medication Sig Start Date End Date Taking? Authorizing Provider  lisinopril (ZESTRIL) 5 MG tablet Take 5 mg by mouth daily.   Yes [provider]  terbinafine (LAMISIL) 250 MG tablet One tablet daily for 7 days, off 3 weeks as one pulse, repeat pulse 3 more times. Patient not taking: Reported on 08/25/2022 12/23/15   Lenn Sink, DPM  terbinafine (LAMISIL) 250 MG tablet Please take one a day x 7days, repeat every 4 weeks x 4 months 06/19/19  Wallene Huh, DPM  terbinafine (LAMISIL) 250 MG tablet Please take one a day x 7days, repeat every 4 weeks x 4 months Patient not taking: Reported on 08/25/2022 02/24/21   Wallene Huh, DPM     Allergies:   No Known Allergies  Social History:   Social History   Socioeconomic History   Marital status: Married    Spouse name: Not on file   Number of children: Not on file   Years of education: Not on file   Highest education level: Not on file  Occupational History   Not on file  Tobacco Use   Smoking status: Never   Smokeless tobacco: Never  Substance and Sexual Activity   Alcohol use: Yes     Comment: daily 1-2 drinks   Drug use: Yes    Types: Marijuana   Sexual activity: Not on file  Other Topics Concern   Not on file  Social History Narrative   Not on file   Social Determinants of Health   Financial Resource Strain: Not on file  Food Insecurity: Not on file  Transportation Needs: Not on file  Physical Activity: Not on file  Stress: Not on file  Social Connections: Not on file  Intimate Partner Violence: Not on file    Family History:   The patient's family history includes AAA (abdominal aortic aneurysm) in his father; Atrial fibrillation in his mother; Stomach cancer in his mother.    ROS:  Please see the history of present illness.  All other ROS reviewed and negative.     Physical Exam/Data:   Vitals:   08/25/22 1517 08/25/22 1545 08/25/22 1630 08/25/22 1734  BP: (!) 170/94 (!) 179/103 (!) 176/115 (!) 154/104  Pulse: 66 66 63 67  Resp: 15 14 18  (!) 22  Temp:      SpO2: 100% 100% 98% 98%  Weight:      Height:       No intake or output data in the 24 hours ending 08/25/22 1952    08/25/2022    1:15 PM 02/02/2015   11:40 AM  Last 3 Weights  Weight (lbs) 195 lb 200 lb  Weight (kg) 88.451 kg 90.719 kg     Body mass index is 25.04 kg/m.  General: Well developed, well nourished, in no acute distress. Head: Normocephalic, atraumatic, sclera non-icteric, no xanthomas, nares are without discharge. Neck: Negative for carotid bruits. JVP not elevated. Lungs: Clear bilaterally to auscultation without wheezes, rales, or rhonchi. Breathing is unlabored. Heart: RRR S1 S2 without murmurs, rubs, or gallops.  Abdomen: Soft, non-tender, non-distended with normoactive bowel sounds. No rebound/guarding. Extremities: No clubbing or cyanosis. No edema. Distal pedal pulses are 2+ and equal bilaterally. Neuro: Alert and oriented X 3. Moves all extremities spontaneously. Psych:  Responds to questions appropriately with a normal affect.    EKG:  The ECG that was done  today was personally reviewed and demonstrates NSR RBBB 71bpm  Relevant CV Studies: none  Laboratory Data:  High Sensitivity Troponin:   Recent Labs  Lab 08/25/22 1318 08/25/22 1517  TROPONINIHS 10 14      Chemistry Recent Labs  Lab 08/25/22 1318 08/25/22 1517  NA 135  --   K 3.5  --   CL 103  --   CO2 22  --   GLUCOSE 109*  --   BUN 12  --   CREATININE 0.95  --   CALCIUM 9.0  --   MG  --  2.1  GFRNONAA >60  --   ANIONGAP 10  --     No results for input(s): "PROT", "ALBUMIN", "AST", "ALT", "ALKPHOS", "BILITOT" in the last 168 hours. Lipids No results for input(s): "CHOL", "TRIG", "HDL", "LABVLDL", "LDLCALC", "CHOLHDL" in the last 168 hours. Hematology Recent Labs  Lab 08/25/22 1318  WBC 5.8  RBC 4.53  HGB 14.2  HCT 40.5  MCV 89.4  MCH 31.3  MCHC 35.1  RDW 12.4  PLT 150   Thyroid  Recent Labs  Lab 08/25/22 1517  TSH 1.326  FREET4 0.96   BNPNo results for input(s): "BNP", "PROBNP" in the last 168 hours.  DDimer No results for input(s): "DDIMER" in the last 168 hours.   Radiology/Studies:  DG Chest Portable 1 View  Result Date: 08/25/2022 CLINICAL DATA:  Chest pain. EXAM: PORTABLE CHEST 1 VIEW COMPARISON:  None Available. FINDINGS: The heart size and mediastinal contours are within normal limits. Both lungs are clear. The visualized skeletal structures are unremarkable. IMPRESSION: No active disease. Electronically Signed   By: Marijo Conception M.D.   On: 08/25/2022 13:40     Assessment and Plan:   1. Chest discomfort with left arm awareness - hsTroponins negative, EKG without ischemia - follow on telemetry to discern whether this could be related to arrhythmia, as no acute evidence of ACS - continue baby aspirin - lipid panel in AM - echo in AM - further workup contingent on echocardiogram, symptoms/tele overnight  2. Transient bradycardia, RBBB - required atropine and temp pacing in EMS - ? Vagal response - difficult to know which came first,  symptoms then brady or brady then symptoms - currently hemodynamically stable without arrhythmia on telemetry - thyroid, Mg OK - K 3.5-> will give 67meq KCl to bring K to 4.0 - check echo - if tele reassuring overnight, consider monitor at discharge - however, live monitor may be cost prohibitive for patient as he is concerned about cost  3. Essential HTN  - per d/w Dr. Dellia Cloud, we will start lisinopril 10mg  daily  - avoid AVN blocking agents  4. Mild hyperglycemia - A1C with AM labs  Will consult social work given no insurance.  Code status: full code   Risk Assessment/Risk Scores:           Severity of Illness: The appropriate patient status for this patient is OBSERVATION. Observation status is judged to be reasonable and necessary in order to provide the required intensity of service to ensure the patient's safety. The patient's presenting symptoms, physical exam findings, and initial radiographic and laboratory data in the context of their medical condition is felt to place them at decreased risk for further clinical deterioration. Furthermore, it is anticipated that the patient will be medically stable for discharge from the hospital within 2 midnights of admission.    For questions or updates, please contact [place] Please consult [email or web address] for contact info under     Signed, Charlie Pitter, PA-C  08/25/2022 7:52 PM

## 2022-08-26 ENCOUNTER — Observation Stay (HOSPITAL_BASED_OUTPATIENT_CLINIC_OR_DEPARTMENT_OTHER): Payer: Self-pay

## 2022-08-26 DIAGNOSIS — R001 Bradycardia, unspecified: Secondary | ICD-10-CM

## 2022-08-26 DIAGNOSIS — R0789 Other chest pain: Secondary | ICD-10-CM | POA: Insufficient documentation

## 2022-08-26 DIAGNOSIS — I10 Essential (primary) hypertension: Secondary | ICD-10-CM

## 2022-08-26 DIAGNOSIS — I351 Nonrheumatic aortic (valve) insufficiency: Secondary | ICD-10-CM

## 2022-08-26 DIAGNOSIS — R079 Chest pain, unspecified: Secondary | ICD-10-CM

## 2022-08-26 LAB — CBC
HCT: 40.5 % (ref 39.0–52.0)
Hemoglobin: 14 g/dL (ref 13.0–17.0)
MCH: 31.6 pg (ref 26.0–34.0)
MCHC: 34.6 g/dL (ref 30.0–36.0)
MCV: 91.4 fL (ref 80.0–100.0)
Platelets: 140 10*3/uL — ABNORMAL LOW (ref 150–400)
RBC: 4.43 MIL/uL (ref 4.22–5.81)
RDW: 12.8 % (ref 11.5–15.5)
WBC: 5.3 10*3/uL (ref 4.0–10.5)
nRBC: 0 % (ref 0.0–0.2)

## 2022-08-26 LAB — BASIC METABOLIC PANEL
Anion gap: 8 (ref 5–15)
BUN: 11 mg/dL (ref 8–23)
CO2: 24 mmol/L (ref 22–32)
Calcium: 8.5 mg/dL — ABNORMAL LOW (ref 8.9–10.3)
Chloride: 108 mmol/L (ref 98–111)
Creatinine, Ser: 1.11 mg/dL (ref 0.61–1.24)
GFR, Estimated: >60 mL/min (ref >60)
Glucose, Bld: 89 mg/dL (ref 70–99)
Potassium: 3.8 mmol/L (ref 3.5–5.1)
Sodium: 140 mmol/L (ref 135–145)

## 2022-08-26 LAB — LIPID PANEL
Cholesterol: 184 mg/dL (ref 0–200)
HDL: 53 mg/dL (ref >40)
LDL Cholesterol: 115 mg/dL — ABNORMAL HIGH (ref 0–99)
Total CHOL/HDL Ratio: 3.5 RATIO
Triglycerides: 78 mg/dL (ref <150)
VLDL: 16 mg/dL (ref 0–40)

## 2022-08-26 LAB — ECHOCARDIOGRAM COMPLETE
AR max vel: 2.84 cm2
AV Peak grad: 10.8 mmHg
Ao pk vel: 1.64 m/s
Area-P 1/2: 2.18 cm2
Height: 74 in
P 1/2 time: 719 msec
S' Lateral: 3.4 cm
Weight: 3120 oz

## 2022-08-26 LAB — HIV ANTIBODY (ROUTINE TESTING W REFLEX): HIV Screen 4th Generation wRfx: NONREACTIVE

## 2022-08-26 MED ORDER — LISINOPRIL 10 MG PO TABS: 10.0000 mg | ORAL_TABLET | Freq: Every day | ORAL | 2 refills | Status: DC

## 2022-08-26 NOTE — Progress Notes (Signed)
Echocardiogram 2D Echocardiogram has been performed.  Leta Jungling M 08/26/2022, 9:12 AM

## 2022-08-26 NOTE — Discharge Summary (Addendum)
Discharge Summary    Patient ID: Shane Rojas MRN: [id number]; DOB: 10/28/1960  Admit date: 08/25/2022 Discharge date: 08/26/2022  PCP:  Merryl Hacker No   [place] [facility] Cardiologist:  Lauree Chandler, MD   (New)  Discharge Diagnoses    Principal Problem:   Bradycardia Active Problems:   Atypical chest pain   Hypertension   Aortic insufficiency    Diagnostic Studies/Procedures    Echocardiogram 08/26/2022: Impressions: 1. Left ventricular ejection fraction, by estimation, is 60 to 65%. Left  ventricular ejection fraction by 3D volume is 60 %. The left ventricle has  normal function. The left ventricle has no regional wall motion  abnormalities. Left ventricular diastolic   parameters were normal.   2. Right ventricular systolic function is normal. The right ventricular  size is normal. Tricuspid regurgitation signal is inadequate for assessing  PA pressure.   3. The mitral valve is normal in structure. No evidence of mitral valve  regurgitation. No evidence of mitral stenosis.   4. The aortic valve is tricuspid. Aortic valve regurgitation is moderate.  No aortic stenosis is present.   5. The inferior vena cava is normal in size with greater than 50%  respiratory variability, suggesting right atrial pressure of 3 mmHg.   Conclusion(s)/Recommendation(s): Eccentric posterior directed AI jet.  Visually appears moderate, but may be underestimating given eccentric jet.  Consider cardiac MRI to quantify AI severity.  _____________   History of Present Illness     Shane Rojas is a 61 y.o. male with a history of hypertension who was admitted on 08/25/2022 for further evaluation of bradycardia and chest discomfort. No prior cardiac history prior to this admission. He reported a history of borderline hypertension and was previously prescribed Lisinopril by his PCP about 1 year ago but stopped taking it as he was trying to mange this with diet and  exercise. 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 He also goes to the gym regularly and does yoga. He has not had any recent angina or dyspnea on exertion. He did report a profound history of vagal reaction after getting blood drawn in the past to the point where he would warn the person drawing blood that they would need to do so with him lying down otherwise he would pass out. Although, he has not had this happen in several years as he has not sought medical care due to lack of insurance. He reported drinking on average 1 beer three times per week and infrequent THC use but no other recreational drug use.  Patient reported he started having an unusual sensation in his chest that he described as "though his heart was occupying more space that it typically did" about 3 weeks ago as well as intermittently feeling an "awareness" of his left arm. No overt chest pain, left arm pain, numbness, or tingling. No facial asymmetry, focal weakness, speech difficulty, or other stroke like symptoms. The symptoms would come and go waxing/ waning for a day at a time and then disappear for days. Nothing seemed to make her symptoms better or worse. He went to an Urgent Care where he was found to be markedly hypertensive despite rechecking it twice. He was subsequently sent to the ED via EMS. While en route, he had an IV started and began to get IV fluids. He notified the EMS driver that he did not feel well all of a sudden and was reported to be bradycardic with heart rates  in the 16s. He was near syncopal with this. This was totally different that his presenting symptoms. There was report that he received Atropine in route and was briefly paced. Symptoms recovered quickly and he was in normal sinus rhythm with RBBB on arrival to the ED. We were unable to see any EMS strips that documented bradycardia.   In the ED, patient markedly hypertensive with BP as high as the 180s/100s. EKG showed  normal sinus rhythm, rate 71 bpm, with RBBB and non-specific ST/T changes. High-sensitivity troponin negative x2. Chest x-ray showed no acute findings. WBC 5.8, Hgb 14.2, Plts 150. Na 135, K 3.5, Glucose 109, BUN 12, Cr 0.95. TSH and free T4 normal. Patient was admitted for evaluation/ management of hypertensive urgency and bradycardia.  Hospital Course     Consultants: None   Sinus Bradycardia Patient heart rates dropped to the 30s during EMS transport after IV fluids had been started. This was felt to likely be vagally mediated as patient has a long history of this with blood draws. He was monitored overnight on telemetry with no evidence of heart block only mild sinus bradycardia with rates in the 50s to 60s. TSH and electrolytes okay. Echo showed normal LV function. Felt to be stable for discharge without outpatient monitor.  Atypical Chest Pain Patient reported very vague chest discomfort in the setting of systolic BP [id number]. EKG showed no acute ischemic changes and high-sensitivity troponin negative x2. Echo showed normal LV function. Symptoms resolved with improvement in BP.  Therefore, no ischemic work felt to be necessary. If he has recurrent chest discomfort with improvement in BP, could consider outpatient ischemic work-up (although patient currently dose not have insurance).  Hypertension Systolic BP initially [id number]. He had not been taking his Lisinopril which was prescribed by PCP last year. Lisinopril was restarted with significant improvement in BP. Will discharge patient on Lisinopril 10mg  daily. He was asked to keep a BP log at home and bring this to his follow-up visit. Will need repeat BMET at follow-up visit.  Aortic Insufficiency Echo this admission showed moderate aortic insufficiency. Can continue to monitor this with serial Echos as an outpatient.  Patient was seen and examined by Dr. Angelena Form today and determined to be stable for discharge. Outpatient follow-up arranged.  Mediations as below.     Did the patient have an acute coronary syndrome (MI, NSTEMI, STEMI, etc) this admission?:  No                               Did the patient have a percutaneous coronary intervention (stent / angioplasty)?:  No.    _____________  Discharge Vitals Blood pressure 138/82, pulse (!) 59, temperature 98 F (36.7 C), resp. rate 14, height 6\' 2"  (1.88 m), weight 88.5 kg, SpO2 95 %.  Filed Weights   08/25/22 1315  Weight: 88.5 kg    Labs & Radiologic Studies    CBC Recent Labs    08/25/22 2107 08/26/22 0520  WBC 5.7 5.3  HGB 12.7* 14.0  HCT 36.6* 40.5  MCV 91.3 91.4  PLT 131* [id number]*   Basic Metabolic Panel Recent Labs    08/25/22 1318 08/25/22 1517 08/25/22 2107 08/26/22 0520  NA 135  --   --  140  K 3.5  --   --  3.8  CL 103  --   --  108  CO2 22  --   --  24  GLUCOSE 109*  --   --  89  BUN 12  --   --  11  CREATININE 0.95  --  0.81 1.11  CALCIUM 9.0  --   --  8.5*  MG  --  2.1  --   --    Liver Function Tests No results for input(s): "AST", "ALT", "ALKPHOS", "BILITOT", "PROT", "ALBUMIN" in the last 72 hours. No results for input(s): "LIPASE", "AMYLASE" in the last 72 hours. High Sensitivity Troponin:   Recent Labs  Lab 08/25/22 1318 08/25/22 1517  TROPONINIHS 10 14    BNP Invalid input(s): "POCBNP" D-Dimer No results for input(s): "DDIMER" in the last 72 hours. Hemoglobin A1C No results for input(s): "HGBA1C" in the last 72 hours. Fasting Lipid Panel Recent Labs    08/26/22 0520  CHOL 184  HDL 53  LDLCALC 115*  TRIG 78  CHOLHDL 3.5   Thyroid Function Tests Recent Labs    08/25/22 1517  TSH 1.326   _____________  ECHOCARDIOGRAM COMPLETE  Result Date: 08/26/2022    ECHOCARDIOGRAM REPORT   Patient Name:   Shane Rojas Date of Exam: 08/26/2022 Medical Rec #:  [id number]       Height:       74.0 in Accession #:    [id number]      Weight:       195.0 lb Date of Birth:  1961-03-25       BSA:          2.150 m Patient Age:     60 years        BP:           160/99 mmHg Patient Gender: M               HR:           59 bpm. Exam Location:  Inpatient Procedure: 2D Echo, 3D Echo, Cardiac Doppler, Color Doppler and Strain Analysis Indications:    Bradycardia [id number]  History:        Patient has no prior history of Echocardiogram examinations.                 Risk Factors:Hypertension.  Sonographer:    Darlina Sicilian RDCS Referring Phys: [place]  1. Left ventricular ejection fraction, by estimation, is 60 to 65%. Left ventricular ejection fraction by 3D volume is 60 %. The left ventricle has normal function. The left ventricle has no regional wall motion abnormalities. Left ventricular diastolic  parameters were normal.  2. Right ventricular systolic function is normal. The right ventricular size is normal. Tricuspid regurgitation signal is inadequate for assessing PA pressure.  3. The mitral valve is normal in structure. No evidence of mitral valve regurgitation. No evidence of mitral stenosis.  4. The aortic valve is tricuspid. Aortic valve regurgitation is moderate. No aortic stenosis is present.  5. The inferior vena cava is normal in size with greater than 50% respiratory variability, suggesting right atrial pressure of 3 mmHg. Conclusion(s)/Recommendation(s): Eccentric posterior directed AI jet. Visually appears moderate, but may be underestimating given eccentric jet. Consider cardiac MRI to quantify AI severity. FINDINGS  Left Ventricle: Left ventricular ejection fraction, by estimation, is 60 to 65%. Left ventricular ejection fraction by 3D volume is 60 %. The left ventricle has normal function. The left ventricle has no regional wall motion abnormalities. The left ventricular internal cavity size was normal in size. There is no left ventricular hypertrophy. Left ventricular diastolic parameters were normal. Right Ventricle: The right ventricular size is normal. No increase  in right ventricular wall thickness.  Right ventricular systolic function is normal. Tricuspid regurgitation signal is inadequate for assessing PA pressure. Left Atrium: Left atrial size was normal in size. Right Atrium: Right atrial size was normal in size. Pericardium: There is no evidence of pericardial effusion. Mitral Valve: The mitral valve is normal in structure. No evidence of mitral valve regurgitation. No evidence of mitral valve stenosis. Tricuspid Valve: The tricuspid valve is normal in structure. Tricuspid valve regurgitation is trivial. No evidence of tricuspid stenosis. Aortic Valve: The aortic valve is tricuspid. Aortic valve regurgitation is moderate. Aortic regurgitation PHT measures 719 msec. No aortic stenosis is present. Aortic valve peak gradient measures 10.8 mmHg. Pulmonic Valve: The pulmonic valve was not well visualized. Pulmonic valve regurgitation is not visualized. No evidence of pulmonic stenosis. Aorta: The aortic root and ascending aorta are structurally normal, with no evidence of dilitation. Venous: The inferior vena cava is normal in size with greater than 50% respiratory variability, suggesting right atrial pressure of 3 mmHg. IAS/Shunts: The interatrial septum was not well visualized.  LEFT VENTRICLE PLAX 2D LVIDd:         5.50 cm         Diastology LVIDs:         3.40 cm         LV e' medial:    6.15 cm/s LV PW:         1.00 cm         LV E/e' medial:  10.1 LV IVS:        1.00 cm         LV e' lateral:   11.80 cm/s LVOT diam:     2.30 cm         LV E/e' lateral: 5.3 LV SV:         94 LV SV Index:   44 LVOT Area:     4.15 cm        3D Volume EF                                LV 3D EF:    Left                                             ventricul                                             ar                                             ejection                                             fraction  by 3D                                             volume is                                              60 %.                                 3D Volume EF:                                3D EF:        60 % RIGHT VENTRICLE RV S prime:     13.60 cm/s TAPSE (M-mode): 2.4 cm LEFT ATRIUM             Index        RIGHT ATRIUM           Index LA Vol (A2C):   56.2 ml 26.14 ml/m  RA Area:     15.30 cm LA Vol (A4C):   56.9 ml 26.46 ml/m  RA Volume:   33.30 ml  15.49 ml/m LA Biplane Vol: 59.4 ml 27.63 ml/m  AORTIC VALVE AV Area (Vmax): 2.84 cm AV Vmax:        164.00 cm/s AV Peak Grad:   10.8 mmHg LVOT Vmax:      112.00 cm/s LVOT Vmean:     70.800 cm/s LVOT VTI:       0.226 m AI PHT:         719 msec  AORTA Ao Root diam: 3.50 cm Ao STJ diam:  2.8 cm Ao Asc diam:  3.40 cm MITRAL VALVE MV Area (PHT): 2.18 cm    SHUNTS MV Decel Time: 348 msec    Systemic VTI:  0.23 m MV E velocity: 62.00 cm/s  Systemic Diam: 2.30 cm MV A velocity: 70.20 cm/s MV E/A ratio:  0.88 Oswaldo Milian MD Electronically signed by Oswaldo Milian MD Signature Date/Time: 08/26/2022/10:45:25 AM    Final    DG Chest Portable 1 View  Result Date: 08/25/2022 CLINICAL DATA:  Chest pain. EXAM: PORTABLE CHEST 1 VIEW COMPARISON:  None Available. FINDINGS: The heart size and mediastinal contours are within normal limits. Both lungs are clear. The visualized skeletal structures are unremarkable. IMPRESSION: No active disease. Electronically Signed   By: Marijo Conception M.D.   On: 08/25/2022 13:40   Disposition   Patient is being discharged home today in good condition.  Follow-up Plans & Appointments     Follow-up Information     Elgie Collard, PA-C Follow up.   Specialty: Cardiology Why: Hospital follow-up with Cardiology scheduled for 09/16/2022 at 8:00am. If this date/time does not work for you, please call our office to reschedule. Contact information: [place] [address] [phone]         Linward Natal, MD Follow up on 09/16/2022.   Why: Time 1015, Please arrive 15 min  early to complete paperwork, PCP establishment Contact information: [place] [address] [phone]  Discharge Instructions     Ambulatory referral to Cardiology   Complete by: As directed    If you have not heard from the Cardiology office within the next 72 hours please call [phone].        Discharge Medications   Allergies as of 08/26/2022   No Known Allergies      Medication List     STOP taking these medications    terbinafine 250 MG tablet Commonly known as: LamISIL       TAKE these medications    lisinopril 10 MG tablet Commonly known as: ZESTRIL Take 1 tablet (10 mg total) by mouth daily. Start taking on: August 27, 2022 What changed:  medication strength how much to take           Outstanding Labs/Studies   Repeat BMET at follow-up visit.  Duration of Discharge Encounter   Greater than 30 minutes including physician time.  Signed, Corrin Parker, PA-C 08/26/2022, 11:32 AM   I have personally seen and examined this patient. I agree with the assessment and plan as outlined above.  See my full note this am.   Verne Carrow, MD, [facility] 08/26/2022 11:53 AM

## 2022-08-26 NOTE — Progress Notes (Signed)
Rounding Note    Patient Name: Shane Rojas Date of Encounter: 08/26/2022  [facility] Cardiologist: None   Subjective   No chest pain or dyspnea.   Inpatient Medications    Scheduled Meds:  aspirin EC  81 mg Oral Daily   heparin  5,000 Units Subcutaneous Q8H   lisinopril  10 mg Oral Daily   sodium chloride flush  3 mL Intravenous Q12H   Continuous Infusions:  sodium chloride     PRN Meds: sodium chloride, acetaminophen, nitroGLYCERIN, ondansetron (ZOFRAN) IV, sodium chloride flush   Vital Signs    Vitals:   08/26/22 0200 08/26/22 0520 08/26/22 0600 08/26/22 0700  BP: 119/69 (!) 144/84 126/63 114/61  Pulse: (!) 52 (!) 58 (!) 49 (!) 54  Resp: 15 11 15 15   Temp:  97.9 F (36.6 C)    TempSrc:  Oral    SpO2: 97% 99% 98% 98%  Weight:      Height:       No intake or output data in the 24 hours ending 08/26/22 0841    08/25/2022    1:15 PM 02/02/2015   11:40 AM  Last 3 Weights  Weight (lbs) 195 lb 200 lb  Weight (kg) 88.451 kg 90.719 kg      Telemetry     Sinus with heart rates from 50 -65 bpm - Personally Reviewed  ECG    Sinus, RBBB, LVH - Personally Reviewed  Physical Exam   GEN: No acute distress.   Neck: No JVD Cardiac: RRR, no murmurs, rubs, or gallops.  Respiratory: Clear to auscultation bilaterally. GI: Soft, nontender, non-distended  MS: No edema; No deformity. Neuro:  Nonfocal  Psych: Normal affect   Labs    High Sensitivity Troponin:   Recent Labs  Lab 08/25/22 1318 08/25/22 1517  TROPONINIHS 10 14     Chemistry Recent Labs  Lab 08/25/22 1318 08/25/22 1517 08/25/22 2107 08/26/22 0520  NA 135  --   --  140  K 3.5  --   --  3.8  CL 103  --   --  108  CO2 22  --   --  24  GLUCOSE 109*  --   --  89  BUN 12  --   --  11  CREATININE 0.95  --  0.81 1.11  CALCIUM 9.0  --   --  8.5*  MG  --  2.1  --   --   GFRNONAA >60  --  >60 >60  ANIONGAP 10  --   --  8    Lipids  Recent Labs  Lab 08/26/22 0520  CHOL  184  TRIG 78  HDL 53  LDLCALC 115*  CHOLHDL 3.5    Hematology Recent Labs  Lab 08/25/22 1318 08/25/22 2107 08/26/22 0520  WBC 5.8 5.7 5.3  RBC 4.53 4.01* 4.43  HGB 14.2 12.7* 14.0  HCT 40.5 36.6* 40.5  MCV 89.4 91.3 91.4  MCH 31.3 31.7 31.6  MCHC 35.1 34.7 34.6  RDW 12.4 12.4 12.8  PLT 150 131* 140*   Thyroid  Recent Labs  Lab 08/25/22 1517  TSH 1.326  FREET4 0.96    BNPNo results for input(s): "BNP", "PROBNP" in the last 168 hours.  DDimer No results for input(s): "DDIMER" in the last 168 hours.   Radiology    DG Chest Portable 1 View  Result Date: 08/25/2022 CLINICAL DATA:  Chest pain. EXAM: PORTABLE CHEST 1 VIEW COMPARISON:  None Available. FINDINGS: The heart  size and mediastinal contours are within normal limits. Both lungs are clear. The visualized skeletal structures are unremarkable. IMPRESSION: No active disease. Electronically Signed   By: Lupita Raider M.D.   On: 08/25/2022 13:40    Cardiac Studies   Echo pending  Patient Profile     61 y.o. male with history of HTN who presented to the ED at [facility] via EMS on 08/25/22 with HTN urgency, chest pressure and bradycardia.   Assessment & Plan    Bradycardia: Sinus bradycardia overnight. His heart rates in the 30s during EMS transport may have been a vagally mediated. No heart block noted during admission. Sinus with rate of 65 bpm on telemetry during my exam. Echo today reviewed at bedside with echo tech. LV systolic function is normal. Diastolic parameters normal. Mild to moderate AI.  -will discharge home today. I do not think a cardiac monitor is indicated  Chest pain: Atypical pain in the setting of systolic blood pressures over 696. Troponin negative x 2. No ischemic changes on EKG. No chest pain or pressure now that his blood pressure is controlled. No plans for ischemic evaluation.   HTN: BP much better controlled on Lisinopril. He had not been taking any medication for his HTN prior to ED arrival  (Lisinopril had been recommended by his primary care provider last year). Will plan to discharge today on Lisinopril 10 mg daily. He will follow his BP at home and bring readings to his office follow up.   Aortic insufficiency: Mild to moderate by echo today. Will need follow up echo in one year.   Discharge home today. Follow up 3-4 weeks in our office.   For questions or updates, please contact [facility] Please consult [email or web address] for contact info under    Signed, Verne Carrow, MD , [facility] 08/26/2022, 8:41 AM

## 2022-08-26 NOTE — Discharge Planning (Signed)
RNCM consulted regarding PCP establishment and insurance enrollment. Pt presented to [facility] ED with hypertension. RNCM called pt on phone listed; pt confirms not having access to follow up care with PCP or insurance coverage. Discussed with patient importance and benefits of establishing PCP, and not utilizing the ED for primary care needs. Pt verbalized understanding and is in agreement. Discussed other options, provided list of local  affordable PCPs. RNCM obtained appointment on (01/05), time (1015) with Adron Bene, MD and placed on After Visit Summary paperwork and MyChart.   Pt discussed being uninsured.  RNCM advised to utilize financial counselors in Internal Medicine Department for help with insurance enrollment and community resources.     Leyla Soliz J. Lucretia Roers, RN, BSN, [place] [phone]

## 2022-08-27 LAB — HEMOGLOBIN A1C
Hgb A1c MFr Bld: 5.2 % (ref 4.8–5.6)
Mean Plasma Glucose: 103 mg/dL

## 2022-08-31 ENCOUNTER — Telehealth: Payer: Self-pay | Admitting: Cardiovascular Disease

## 2022-08-31 MED ORDER — LISINOPRIL 20 MG PO TABS: 20.0000 mg | ORAL_TABLET | Freq: Every day | ORAL | 3 refills | Status: DC

## 2022-08-31 NOTE — Telephone Encounter (Signed)
Pt c/o medication issue:  1. Name of Medication: Lisinopril  2. How are you currently taking this medication (dosage and times per day)?  1 time a day     3. Are you having a reaction (difficulty breathing--STAT)?   4. What is your medication issue? Blood pressure is still running high- should he increase his Lisinopril       Pt c/o BP issue: STAT if pt c/o blurred vision, one-sided weakness or slurred speech  1. What are your last 5 BP readings? 177/98, 163/189, 180/99, 168/96 and 169/98  2. Are you having any other symptoms (ex. Dizziness, headache, blurred vision, passed out)?  No symptoms, just concerned about the readings  3. What is your BP issue? Blood pressure is running high

## 2022-08-31 NOTE — Telephone Encounter (Signed)
Reviewed with Dr. Clifton James, patient to increase lisinopril to 20 mg daily.  Called patient to inform.  Medication list updated.  Patient has not been sitting still for a few minutes before checking so he will start doing that when taking the readings.  I asked him to send in a few more readings or bring them w him to next appt w Tessa.

## 2022-09-15 NOTE — Progress Notes (Signed)
Office Visit    Patient Name: Shane Rojas Date of Encounter: 09/16/2022  PCP:  Aviva Kluver    [facility]  Cardiologist:  Verne Carrow, MD  Advanced Practice Provider:  No care team member to display Electrophysiologist:  None   HPI    Shane Rojas is a 62 y.o. male with a past medical history of uncontrolled hypertension presents today for follow-up visit.   He endorsed several weeks of palpitations. He went to urgent care where he was found to be hypertensive with SBPs in the 220s. He was en route to the hospital and was bradying down to the 30s. He believed to have associated loss of consciousness. Patient given atropine and pacing initiated.   Serial troponins negative. He was not compliant with his lisinopril at the time because he does not like taking medications and was trying yoga to get his BP down. Heart rate was in the 70s and no further episodes of bradycardia. He did have PVCs and PACs on the monitor in the hospital which may explain his palpitations.   Today, he tells me that his symptoms started with an acute awareness of his heart.  He felt his heart pounding in his head and ignored it initially but then decided to go forward with an evaluation.  Every once in a while he does feel his heart flutter but does not last long.  He was offered a monitor at discharge but he tells me Dr. Clifton James ultimately decided against it since the hospital monitor did not show any dangerous arrhythmias and the patient is uninsured.  He tells me he has not needed a doctor in many years due to religiously practicing yoga, meditation, and his supplement use.  He did bring in a log of his blood pressures which have all been moderately elevated.  He explains that his syncopal episode in the EMS was likely related to a needle prick which she has experienced vasovagal episodes in the past.  He states his palpitations have gotten significantly better and he barely notices  them now.  We reviewed recent labs we have the hospital as well as his most recent echocardiogram.  Reports no shortness of breath nor dyspnea on exertion. Reports no chest pain, pressure, or tightness. No edema, orthopnea, PND.   Past Medical History    Past Medical History:  Diagnosis Date   Hypertension    History reviewed. No pertinent surgical history.  Allergies  No Known Allergies  EKGs/Labs/Other Studies Reviewed:   The following studies were reviewed today:  Echocardiogram 08/26/2022 IMPRESSIONS     1. Left ventricular ejection fraction, by estimation, is 60 to 65%. Left  ventricular ejection fraction by 3D volume is 60 %. The left ventricle has  normal function. The left ventricle has no regional wall motion  abnormalities. Left ventricular diastolic   parameters were normal.   2. Right ventricular systolic function is normal. The right ventricular  size is normal. Tricuspid regurgitation signal is inadequate for assessing  PA pressure.   3. The mitral valve is normal in structure. No evidence of mitral valve  regurgitation. No evidence of mitral stenosis.   4. The aortic valve is tricuspid. Aortic valve regurgitation is moderate.  No aortic stenosis is present.   5. The inferior vena cava is normal in size with greater than 50%  respiratory variability, suggesting right atrial pressure of 3 mmHg.   Conclusion(s)/Recommendation(s): Eccentric posterior directed AI jet.  Visually appears moderate, but may be underestimating  given eccentric jet.  Consider cardiac MRI to quantify AI severity.   FINDINGS   Left Ventricle: Left ventricular ejection fraction, by estimation, is 60  to 65%. Left ventricular ejection fraction by 3D volume is 60 %. The left  ventricle has normal function. The left ventricle has no regional wall  motion abnormalities. The left  ventricular internal cavity size was normal in size. There is no left  ventricular hypertrophy. Left  ventricular diastolic parameters were  normal.   Right Ventricle: The right ventricular size is normal. No increase in  right ventricular wall thickness. Right ventricular systolic function is  normal. Tricuspid regurgitation signal is inadequate for assessing PA  pressure.   Left Atrium: Left atrial size was normal in size.   Right Atrium: Right atrial size was normal in size.   Pericardium: There is no evidence of pericardial effusion.   Mitral Valve: The mitral valve is normal in structure. No evidence of  mitral valve regurgitation. No evidence of mitral valve stenosis.   Tricuspid Valve: The tricuspid valve is normal in structure. Tricuspid  valve regurgitation is trivial. No evidence of tricuspid stenosis.   Aortic Valve: The aortic valve is tricuspid. Aortic valve regurgitation is  moderate. Aortic regurgitation PHT measures 719 msec. No aortic stenosis  is present. Aortic valve peak gradient measures 10.8 mmHg.   Pulmonic Valve: The pulmonic valve was not well visualized. Pulmonic valve  regurgitation is not visualized. No evidence of pulmonic stenosis.   Aorta: The aortic root and ascending aorta are structurally normal, with  no evidence of dilitation.   Venous: The inferior vena cava is normal in size with greater than 50%  respiratory variability, suggesting right atrial pressure of 3 mmHg.   IAS/Shunts: The interatrial septum was not well visualized.   EKG:  EKG is  ordered today.    Recent Labs: 08/25/2022: Magnesium 2.1; TSH 1.326 08/26/2022: BUN 11; Creatinine, Ser 1.11; Hemoglobin 14.0; Platelets 140; Potassium 3.8; Sodium 140  Recent Lipid Panel    Component Value Date/Time   CHOL 184 08/26/2022 0520   TRIG 78 08/26/2022 0520   HDL 53 08/26/2022 0520   CHOLHDL 3.5 08/26/2022 0520   VLDL 16 08/26/2022 0520   LDLCALC 115 (H) 08/26/2022 0520   Home Medications   Current Meds  Medication Sig   lisinopril (ZESTRIL) 40 MG tablet Take 1 tablet (40 mg  total) by mouth daily.   [DISCONTINUED] lisinopril (ZESTRIL) 20 MG tablet Take 1 tablet (20 mg total) by mouth daily.     Review of Systems      All other systems reviewed and are otherwise negative except as noted above.  Physical Exam    VS:  BP (!) 150/100 Comment: 160/102 Right arm  Pulse 70   Ht 6\' 2"  (1.88 m)   Wt 193 lb 3.2 oz (87.6 kg)   SpO2 97%   BMI 24.81 kg/m  , BMI Body mass index is 24.81 kg/m.  Wt Readings from Last 3 Encounters:  09/16/22 193 lb 3.2 oz (87.6 kg)  08/25/22 195 lb (88.5 kg)  02/02/15 200 lb (90.7 kg)     GEN: Well nourished, well developed, in no acute distress. HEENT: normal. Neck: Supple, no JVD, carotid bruits, or masses. Cardiac: RRR, no murmurs, rubs, or gallops. No clubbing, cyanosis, edema.  Radials/PT 2+ and equal bilaterally.  Respiratory:  Respirations regular and unlabored, clear to auscultation bilaterally. GI: Soft, nontender, nondistended. MS: No deformity or atrophy. Skin: Warm and dry, no rash. Neuro:  Strength and sensation are intact. Psych: Normal affect.  Assessment & Plan    Chest pain -This seems to have been related to his elevated blood pressure -We have increased his lisinopril today and he is asked to keep track of his blood pressure -He describes an acute awareness of his heart where he could "trace it on his chest"  Sinus bradycardia/palpitations -No further episodes of bradycardia -Avoiding nodal agents -Will defer monitor for now but if he experiences an increase in palpitations or funny sensations in his chest he is to call us  Hypertension -Increase lisinopril to 40 mg daily -He is tolerating the medication well -He is asked to continue to track his blood pressure an hour after morning medications -If remains high we did discuss potentially adding amlodipine to his regimen -Encouraged to continue his exercise routine -Encouraged low-sodium diet    Disposition: Follow up 1 month with nursing visit  and 3 months with Lauree Chandler, MD or APP.  Signed, Elgie Collard, PA-C 09/16/2022, 8:50 AM [place]

## 2022-09-16 ENCOUNTER — Ambulatory Visit: Payer: Self-pay | Attending: Physician Assistant | Admitting: Physician Assistant

## 2022-09-16 ENCOUNTER — Encounter: Payer: Self-pay | Admitting: Physician Assistant

## 2022-09-16 VITALS — BP 150/100 | HR 70 | Ht 74.0 in | Wt 193.2 lb

## 2022-09-16 DIAGNOSIS — R0789 Other chest pain: Secondary | ICD-10-CM

## 2022-09-16 DIAGNOSIS — R001 Bradycardia, unspecified: Secondary | ICD-10-CM

## 2022-09-16 DIAGNOSIS — I10 Essential (primary) hypertension: Secondary | ICD-10-CM

## 2022-09-16 MED ORDER — LISINOPRIL 40 MG PO TABS: 40.0000 mg | ORAL_TABLET | Freq: Every day | ORAL | 3 refills | Status: DC

## 2022-09-16 NOTE — Patient Instructions (Signed)
Medication Instructions:  1.Increase lisinopril to 40 mg daily *If you need a refill on your cardiac medications before your next appointment, please call your pharmacy*   Lab Work: None If you have labs (blood work) drawn today and your tests are completely normal, you will receive your results only by: [place] (if you have MyChart) OR A paper copy in the mail If you have any lab test that is abnormal or we need to change your treatment, we will call you to review the results.   Follow-Up: At [facility], you and your health needs are our priority.  As part of our continuing mission to provide you with exceptional heart care, we have created designated Provider Care Teams.  These Care Teams include your primary Cardiologist (physician) and Advanced Practice Providers (APPs -  Physician Assistants and Nurse Practitioners) who all work together to provide you with the care you need, when you need it.  1 month nurse visit for a blood pressure check 3 months with Dr Angelena Form  Other Instructions Continue to check your blood pressure at home, one hour after taking your morning medications, you can also check it in the evening. If over the next few weeks your blood pressure ranges 150's-160's on the top number call and let us know.  Important Information About Sugar

## 2022-09-23 ENCOUNTER — Encounter: Payer: Self-pay | Admitting: Internal Medicine

## 2022-09-29 ENCOUNTER — Telehealth: Payer: Self-pay | Admitting: Physician Assistant

## 2022-09-29 NOTE — Telephone Encounter (Signed)
Patient returning call.

## 2022-09-29 NOTE — Telephone Encounter (Signed)
Left message for patient to call back.

## 2022-09-29 NOTE — Telephone Encounter (Signed)
Spoke with the patient who states that his blood pressure has remained elevated after increasing lisinopril to 40 mg daily. He reports that following readings: 164/101, 158/106, 162/103, 165/102, 154/96. He states that when he brought his cuff into the office and compared his was about 10 points higher than what was read on the manual cuff in our office, regardless readings are still elevated. Patient then reports that he is taking his blood pressure first thing in the morning prior to taking his medication. I have advised the patient to start checking his blood pressure a couple hours after he takes his blood pressure medication and keep up with a list of those readings and let us know next week what those are. Patient verbalized understanding.

## 2022-09-29 NOTE — Telephone Encounter (Signed)
  Pt c/o medication issue:  1. Name of Medication: isinopril (ZESTRIL) 40 MG tablet   2. How are you currently taking this medication (dosage and times per day)?   Take 1 tablet (40 mg total) by mouth daily.    3. Are you having a reaction (difficulty breathing--STAT)? No   4. What is your medication issue? Pt called, he said, he's been taking this medication for 2 weeks now but there's no improvement with his BP, it is still elevated. He wants to know the next  plan of care.

## 2022-10-14 ENCOUNTER — Ambulatory Visit: Payer: Self-pay | Attending: Interventional Cardiology | Admitting: *Deleted

## 2022-10-14 VITALS — BP 154/91 | HR 92 | Ht 74.0 in

## 2022-10-14 DIAGNOSIS — I10 Essential (primary) hypertension: Secondary | ICD-10-CM

## 2022-10-14 MED ORDER — AMLODIPINE BESYLATE 5 MG PO TABS: 5.0000 mg | ORAL_TABLET | Freq: Every day | ORAL | 3 refills | Status: DC

## 2022-10-14 NOTE — Progress Notes (Signed)
   Nurse Visit   Date of Encounter: 10/14/2022 ID: Kiril Hippe, DOB 02/10/1961, MRN [id number]  PCP:  Pcp, No   [place] [facility] Cardiologist:  Lauree Chandler, MD {   Visit Details   VS:  BP (!) 154/91   Pulse 92   Ht 6\' 2"  (1.88 m)   BMI 24.81 kg/m  , BMI Body mass index is 24.81 kg/m.  Wt Readings from Last 3 Encounters:  09/16/22 193 lb 3.2 oz (87.6 kg)  08/25/22 195 lb (88.5 kg)  02/02/15 200 lb (90.7 kg)     Reason for visit: blood pressure check Performed today: Vitals, Provider consulted:Dr. Irish Lack, and Education Changes (medications, testing, etc.) : MD added Norvasc 5 mg, pt will monitor at home Length of Visit: 30 minutes    Medications Adjustments/Labs and Tests Ordered: No orders of the defined types were placed in this encounter.  Meds ordered this encounter  Medications   amLODipine (NORVASC) 5 MG tablet    Sig: Take 1 tablet (5 mg total) by mouth daily.    Dispense:  30 tablet    Refill:  3     Signed, Stanton Kidney, RN  10/14/2022 2:44 PM

## 2022-10-14 NOTE — Patient Instructions (Addendum)
Medication Instructions:  Your physician has recommended you make the following change in your medication:  START Amlodipine 5 mg once daily  *If you need a refill on your cardiac medications before your next appointment, please call your pharmacy*   Lab Work: None ordered   Testing/Procedures: None ordered   Follow-Up: At [facility], you and your health needs are our priority.  As part of our continuing mission to provide you with exceptional heart care, we have created designated Provider Care Teams.  These Care Teams include your primary Cardiologist (physician) and Advanced Practice Providers (APPs -  Physician Assistants and Nurse Practitioners) who all work together to provide you with the care you need, when you need it.  Your next appointment:   Keep your follow up  in April  The format for your next appointment:   In Person  Provider:   Lauree Chandler, MD {     Thank you for choosing [facility]!!   [phone]  Other Instructions  Amlodipine Tablets What is this medication? AMLODIPINE (am LOE di peen) treats high blood pressure and prevents chest pain (angina). It works by relaxing the blood vessels, which helps decrease the amount of work your heart has to do. It belongs to a group of medications called calcium channel blockers. This medicine may be used for other purposes; ask your health care provider or pharmacist if you have questions. COMMON BRAND NAME(S): Norvasc What should I tell my care team before I take this medication? They need to know if you have any of these conditions: Heart disease Liver disease An unusual or allergic reaction to amlodipine, other medications, foods, dyes, or preservatives Pregnant or trying to get pregnant Breastfeeding How should I use this medication? Take this medication by mouth. Take it as directed on the prescription label at the same time every day. You can take it with or without food. If it upsets  your stomach, take it with food. Keep taking it unless your care team tells you to stop. Talk to your care team about the use of this medication in children. While it may be prescribed for children as young as 6 for selected conditions, precautions do apply. Overdosage: If you think you have taken too much of this medicine contact a poison control center or emergency room at once. NOTE: This medicine is only for you. Do not share this medicine with others. What if I miss a dose? If you miss a dose, take it as soon as you can. If it is almost time for your next dose, take only that dose. Do not take double or extra doses. What may interact with this medication? Clarithromycin Cyclosporine Diltiazem Itraconazole Simvastatin Tacrolimus This list may not describe all possible interactions. Give your health care provider a list of all the medicines, herbs, non-prescription drugs, or dietary supplements you use. Also tell them if you smoke, drink alcohol, or use illegal drugs. Some items may interact with your medicine. What should I watch for while using this medication? Visit your care team for regular checks on your progress. Check your blood pressure as directed. Know what your blood pressure should be and when to contact your care team. Do not treat yourself for coughs, colds, or pain while you are using this medication without asking your care team for advice. Some medications may increase your blood pressure. This medication may affect your coordination, reaction time, or judgment. Do not drive or operate machinery until you know how this medication affects  you. Sit up or stand slowly to reduce the risk of dizzy or fainting spells. Drinking alcohol with this medication can increase the risk of these side effects. What side effects may I notice from receiving this medication? Side effects that you should report to your care team as soon as possible: Allergic reactions--skin rash, itching, hives,  swelling of the face, lips, tongue, or throat Heart attack--pain or tightness in the chest, shoulders, arms, or jaw, nausea, shortness of breath, cold or clammy skin, feeling faint or lightheaded Low blood pressure--dizziness, feeling faint or lightheaded, blurry vision Worsening chest pain (angina)--pain, pressure, or tightness in the chest, neck, back, or arms Side effects that usually do not require medical attention (report these to your care team if they continue or are bothersome): Facial flushing, redness Heart palpitations--rapid, pounding, or irregular heartbeat Nausea Stomach pain Swelling of the ankles, hands, or feet This list may not describe all possible side effects. Call your doctor for medical advice about side effects. You may report side effects to FDA at 1-800-FDA-1088. Where should I keep my medication? Keep out of the reach of children and pets. Store at room temperature between 20 and 25 degrees C (68 and 77 degrees F). Protect from light and moisture. Keep the container tightly closed. Get rid of any unused medication after the expiration date. To get rid of medications that are no longer needed or have expired: Take the medication to a medication take-back program. Check with your pharmacy or law enforcement to find a location. If you cannot return the medication, check the label or package insert to see if the medication should be thrown out in the garbage or flushed down the toilet. If you are not sure, ask your care team. If it is safe to put in the trash, empty the medication out of the container. Mix the medication with cat litter, dirt, coffee grounds, or other unwanted substance. Seal the mixture in a bag or container. Put it in the trash. NOTE: This sheet is a summary. It may not cover all possible information. If you have questions about this medicine, talk to your doctor, pharmacist, or health care provider.  2023 Elsevier/Gold Standard (2022-03-21  00:00:00)

## 2022-12-01 ENCOUNTER — Telehealth: Payer: Self-pay | Admitting: Cardiovascular Disease

## 2022-12-01 NOTE — Telephone Encounter (Signed)
Pt c/o medication issue:  1. Name of Medication:           amLODipine (NORVASC) 5 MG tablet      2. How are you currently taking this medication (dosage and times per day)?   Take 1 tablet (5 mg total) by mouth daily.    3. Are you having a reaction (difficulty breathing--STAT)? No  4. What is your medication issue? Pt states that current dosage does not seem to be lowering BP and would like to know if it should be increased. Please advise

## 2022-12-01 NOTE — Telephone Encounter (Signed)
Amlodipine 5 mg was added at last ov. Systolic has come down to 150s --down from [id number] Diastolic remains [id number] from around 100  He would like to increase this for a couple weeks before his follow up on 12/19/22.  I adv to go up to 10 mg daily and continue to monitor blood pressures. Adv I would inform Dr. Angelena Form and if he has other recommendations we will call him back.  Pt pleased w this plan.

## 2022-12-19 ENCOUNTER — Ambulatory Visit: Payer: Self-pay | Admitting: Cardiovascular Disease

## 2022-12-20 NOTE — Progress Notes (Signed)
Cardiology Office Note:    Date:  12/21/2022   ID:  Shane Rojas, DOB 05-17-1961, MRN [id number]  PCP:  Oneita Hurt No   [facility] Providers Cardiologist:  Verne Carrow, MD     Referring MD: No ref. provider found   Chief Complaint: hypertension  History of Present Illness:    Shane Rojas is a very pleasant 62 y.o. male with a hx of uncontrolled hypertension, palpitations, and hyperlipidemia.   He established with cardiology during admission for bradycardia 08/25/2022.  No prior cardiac history prior to admission. He reported no consistent medical care due to lack of insurance. He endorsed several weeks of palpitations.  He went to urgent care where he was found to be hypertensive with SBP's in the 220s.  He was in route to the hospital and was braiding down to the 30s.  Believed to have associated loss of consciousness.  He was given atropine and pacing was initiated.  Serial troponins were negative.  He was not compliant with his lisinopril at the time because he does not like taking medications and was trying yoga to get BP down. Heart rate was in the 70s and he had no further episodes of bradycardia. He did have PVCs and PACs on the monitor in the hospital which may explain his palpitations.  Sinus bradycardia felt to be vagal response as IV was started by EMS.  He was monitored overnight on telemetry with no evidence of heart block, only mild sinus bradycardia with rates in the 50s to 60s.  TSH and electrolytes were okay. TTE showed normal LV function, moderate aortic insufficiency.  He had mild chest discomfort with very high BP.  He was resumed on lisinopril 10 mg daily and scheduled for outpatient follow-up.  Seen in cardiology clinic on 09/16/2022 by Jari Favre, PA.  He reported occasional heart fluttering.  He was offered a monitor at discharge but states Dr. Clifton James ultimately decided against it due to he is uninsured.  His BP log revealed moderately elevated blood  pressures.  Lisinopril was increased to 40 mg daily.  He was advised to track BP and return in 1 month for nurse visit.  At nurse visit 10/14/2022 BP was 154/91, pulse 92.  He was advised to start amlodipine 5 mg daily in addition to lisinopril. He called a few weeks later to report consistently elevated blood pressure readings and amlodipine was increased to 10 mg daily.  Today, he is here for follow-up of hypertension. Has home BP readings that range from 120s to 145 systolic and 80-90s diastolic. These are consistently taken as soon as he wakes up. He has not monitored BP after taking antihypertensives due to his work schedule. Continues to report "an awareness of where my heart is." Describes it as soreness that feels like weight lifting type soreness but on the inside. Feels a sensation of mild breathlessness, but can continue to do all activities. He denies orthopnea, PND, edema, presyncope, syncope. Eats a healthy diet, does yoga, meditates, has one cup of coffee daily and limits alcohol.   Past Medical History:  Diagnosis Date   Hypertension     History reviewed. No pertinent surgical history.  Current Medications: Current Meds  Medication Sig   [DISCONTINUED] amLODipine (NORVASC) 5 MG tablet Take 1 tablet (5 mg total) by mouth daily. (Patient taking differently: Take 5 mg by mouth daily. Pt taking 10 mg daily)   [DISCONTINUED] lisinopril (ZESTRIL) 40 MG tablet Take 1 tablet (40 mg total) by mouth daily.  Allergies:   Patient has no known allergies.   Social History   Socioeconomic History   Marital status: Single    Spouse name: Not on file   Number of children: Not on file   Years of education: Not on file   Highest education level: Not on file  Occupational History   Not on file  Tobacco Use   Smoking status: Never   Smokeless tobacco: Never  Substance and Sexual Activity   Alcohol use: Yes    Comment: daily 1-2 drinks   Drug use: Yes    Types: Marijuana   Sexual  activity: Not on file  Other Topics Concern   Not on file  Social History Narrative   Not on file   Social Determinants of Health   Financial Resource Strain: Not on file  Food Insecurity: Not on file  Transportation Needs: Not on file  Physical Activity: Not on file  Stress: Not on file  Social Connections: Not on file     Family History: The patient's family history includes AAA (abdominal aortic aneurysm) in his father; Atrial fibrillation in his mother; Stomach cancer in his mother.  ROS:   Please see the history of present illness.    + occasional chest discomfort All other systems reviewed and are negative.  Labs/Other Studies Reviewed:    The following studies were reviewed today:  Echo 08/26/22 LVEF 60 to 65%, no RWMA, normal diastolic parameters Normal RV Moderate aortic valve insufficiency  Recent Labs: 08/25/2022: Magnesium 2.1; TSH 1.326 08/26/2022: BUN 11; Creatinine, Ser 1.11; Hemoglobin 14.0; Platelets 140; Potassium 3.8; Sodium 140  Recent Lipid Panel    Component Value Date/Time   CHOL 184 08/26/2022 0520   TRIG 78 08/26/2022 0520   HDL 53 08/26/2022 0520   CHOLHDL 3.5 08/26/2022 0520   VLDL 16 08/26/2022 0520   LDLCALC 115 (H) 08/26/2022 0520     Risk Assessment/Calculations:           Physical Exam:    VS:  BP (!) 140/88   Pulse 72   Ht 6\' 2"  (1.88 m)   Wt 195 lb 3.2 oz (88.5 kg)   SpO2 97%   BMI 25.06 kg/m     Wt Readings from Last 3 Encounters:  12/21/22 195 lb 3.2 oz (88.5 kg)  09/16/22 193 lb 3.2 oz (87.6 kg)  08/25/22 195 lb (88.5 kg)     GEN:  Well nourished, well developed in no acute distress HEENT: Normal NECK: No JVD; No carotid bruits CARDIAC: RRR, no murmurs, rubs, gallops RESPIRATORY:  Clear to auscultation without rales, wheezing or rhonchi  ABDOMEN: Soft, non-tender, non-distended MUSCULOSKELETAL:  No edema; No deformity. 2+ pedal pulses, equal bilaterally SKIN: Warm and dry NEUROLOGIC:  Alert and oriented  x 3 PSYCHIATRIC:  Normal affect   EKG:  EKG is not ordered today.    Diagnoses:    1. Essential hypertension   2. Sinus bradycardia   3. Atypical chest pain   4. Nonrheumatic aortic valve insufficiency   5. Hyperlipidemia LDL goal <100    Assessment and Plan:     Chest discomfort: He continues to have mild chest discomfort that he describes as "an awareness or a soreness." Reviewed echo from 08/2022, reassurance provided. We discussed various options for home monitoring including Kardia monitor. Advised him of out-of-pocket cost of 14 day Zio monitor. He will consider these options and let us know if he decides to proceed with Zio. He does not want to pursue  further testing at this time.   Hypertension: BP is mildly elevated today. Home readings reveal SBP 128-145 mmHg. These are taken prior to medication. I suspect that his BP is well-controlled with medication on board. He will try to take an occasional reading at least 2 hours after p.o. medications. Plan to take amlodipine and lisinopril in divided doses every 12 hours rather than once daily.  Will check renal function and electrolytes today. Continue amlodipine 5 mg twice daily and lisinopril 20 mg twice daily.   Aortic insufficiency: Moderate on TTE 08/26/22. I do not appreciate a significant murmur on exam. We will continue to monitor clinically at this time.  Hyperlipidemia: LDL 115 on 08/26/22. We will recheck today. He is already eating a mostly plant based diet, avoids saturated fat, processed foods. He will continue to research dietary and natural options for high cholesterol, does not want to take a statin.     Disposition: 1 year with Dr. Clifton James  Medication Adjustments/Labs and Tests Ordered: Current medicines are reviewed at length with the patient today.  Concerns regarding medicines are outlined above.  Orders Placed This Encounter  Procedures   Comp Met (CMET)   Lipid Profile   Meds ordered this encounter   Medications   amLODipine (NORVASC) 5 MG tablet    Sig: Take 1 tablet (5 mg total) by mouth 2 (two) times daily.    Dispense:  180 tablet    Refill:  3    Order Specific Question:   Supervising Provider    Answer:   Elease Hashimoto, PHILIP J [[id number]]   lisinopril (ZESTRIL) 40 MG tablet    Sig: Take 1 tablet (40 mg total) by mouth in the morning and at bedtime.    Dispense:  180 tablet    Refill:  3    Patient Instructions  Medication Instructions:   Your physician recommends that you continue on your current medications as directed. Please refer to the Current Medication list given to you today.   *If you need a refill on your cardiac medications before your next appointment, please call your pharmacy*   Lab Work:  TODAY!!!! CMET/LIPID  If you have labs (blood work) drawn today and your tests are completely normal, you will receive your results only by: MyChart Message (if you have MyChart) OR A paper copy in the mail If you have any lab test that is abnormal or we need to change your treatment, we will call you to review the results.   Testing/Procedures:  None ordered.   Follow-Up: At [facility], you and your health needs are our priority.  As part of our continuing mission to provide you with exceptional heart care, we have created designated Provider Care Teams.  These Care Teams include your primary Cardiologist (physician) and Advanced Practice Providers (APPs -  Physician Assistants and Nurse Practitioners) who all work together to provide you with the care you need, when you need it.  We recommend signing up for the patient portal called "MyChart".  Sign up information is provided on this After Visit Summary.  MyChart is used to connect with patients for Virtual Visits (Telemedicine).  Patients are able to view lab/test results, encounter notes, upcoming appointments, etc.  Non-urgent messages can be sent to your provider as well.   To learn more about what you can  do with MyChart, go to [email or web address].    Your next appointment:   1 year(s)  Provider:   Verne Carrow, MD  Other Instructions  Your physician wants you to follow-up in: 1 year with Dr. Clifton James. You will receive a reminder letter in the mail two months in advance. If you don't receive a letter, please call our office to schedule the follow-up appointment.     Signed, Levi Aland, NP  12/21/2022 11:24 AM    [facility]

## 2022-12-21 ENCOUNTER — Encounter: Payer: Self-pay | Admitting: Nurse Practitioner

## 2022-12-21 ENCOUNTER — Ambulatory Visit: Payer: Self-pay | Attending: Cardiovascular Disease | Admitting: Nurse Practitioner

## 2022-12-21 ENCOUNTER — Ambulatory Visit: Payer: Self-pay | Admitting: Physician Assistant

## 2022-12-21 VITALS — BP 140/88 | HR 72 | Ht 74.0 in | Wt 195.2 lb

## 2022-12-21 DIAGNOSIS — R0789 Other chest pain: Secondary | ICD-10-CM

## 2022-12-21 DIAGNOSIS — R001 Bradycardia, unspecified: Secondary | ICD-10-CM

## 2022-12-21 DIAGNOSIS — I10 Essential (primary) hypertension: Secondary | ICD-10-CM

## 2022-12-21 DIAGNOSIS — E785 Hyperlipidemia, unspecified: Secondary | ICD-10-CM

## 2022-12-21 DIAGNOSIS — I351 Nonrheumatic aortic (valve) insufficiency: Secondary | ICD-10-CM

## 2022-12-21 MED ORDER — AMLODIPINE BESYLATE 5 MG PO TABS: 5.0000 mg | ORAL_TABLET | Freq: Two times a day (BID) | ORAL | 3 refills | Status: DC

## 2022-12-21 MED ORDER — LISINOPRIL 40 MG PO TABS: 40.0000 mg | ORAL_TABLET | Freq: Two times a day (BID) | ORAL | 3 refills | Status: DC

## 2022-12-21 NOTE — Patient Instructions (Signed)
Medication Instructions:   Your physician recommends that you continue on your current medications as directed. Please refer to the Current Medication list given to you today.   *If you need a refill on your cardiac medications before your next appointment, please call your pharmacy*   Lab Work:  TODAY!!!! CMET/LIPID  If you have labs (blood work) drawn today and your tests are completely normal, you will receive your results only by: MyChart Message (if you have MyChart) OR A paper copy in the mail If you have any lab test that is abnormal or we need to change your treatment, we will call you to review the results.   Testing/Procedures:  None ordered.   Follow-Up: At [facility], you and your health needs are our priority.  As part of our continuing mission to provide you with exceptional heart care, we have created designated Provider Care Teams.  These Care Teams include your primary Cardiologist (physician) and Advanced Practice Providers (APPs -  Physician Assistants and Nurse Practitioners) who all work together to provide you with the care you need, when you need it.  We recommend signing up for the patient portal called "MyChart".  Sign up information is provided on this After Visit Summary.  MyChart is used to connect with patients for Virtual Visits (Telemedicine).  Patients are able to view lab/test results, encounter notes, upcoming appointments, etc.  Non-urgent messages can be sent to your provider as well.   To learn more about what you can do with MyChart, go to [email or web address].    Your next appointment:   1 year(s)  Provider:   Verne Carrow, MD     Other Instructions  Your physician wants you to follow-up in: 1 year with Dr. Clifton James. You will receive a reminder letter in the mail two months in advance. If you don't receive a letter, please call our office to schedule the follow-up appointment.

## 2022-12-22 ENCOUNTER — Other Ambulatory Visit: Payer: Self-pay | Admitting: *Deleted

## 2022-12-22 ENCOUNTER — Other Ambulatory Visit: Payer: Self-pay | Admitting: Nurse Practitioner

## 2022-12-22 DIAGNOSIS — R001 Bradycardia, unspecified: Secondary | ICD-10-CM

## 2022-12-22 DIAGNOSIS — I10 Essential (primary) hypertension: Secondary | ICD-10-CM

## 2022-12-22 DIAGNOSIS — R0789 Other chest pain: Secondary | ICD-10-CM

## 2022-12-22 LAB — COMPREHENSIVE METABOLIC PANEL
ALT: 18 IU/L (ref 0–44)
AST: 20 IU/L (ref 0–40)
Albumin/Globulin Ratio: 2.2 (ref 1.2–2.2)
Albumin: 4.7 g/dL (ref 3.9–4.9)
Alkaline Phosphatase: 92 IU/L (ref 44–121)
BUN/Creatinine Ratio: 17 (ref 10–24)
BUN: 15 mg/dL (ref 8–27)
Bilirubin Total: 0.6 mg/dL (ref 0.0–1.2)
CO2: 23 mmol/L (ref 20–29)
Calcium: 9.3 mg/dL (ref 8.6–10.2)
Chloride: 103 mmol/L (ref 96–106)
Creatinine, Ser: 0.89 mg/dL (ref 0.76–1.27)
Globulin, Total: 2.1 g/dL (ref 1.5–4.5)
Glucose: 95 mg/dL (ref 70–99)
Potassium: 5.1 mmol/L (ref 3.5–5.2)
Sodium: 138 mmol/L (ref 134–144)
Total Protein: 6.8 g/dL (ref 6.0–8.5)
eGFR: 97 mL/min/{1.73_m2} (ref >59)

## 2022-12-22 LAB — LIPID PANEL
Chol/HDL Ratio: 3.1 ratio (ref 0.0–5.0)
Cholesterol, Total: 215 mg/dL — ABNORMAL HIGH (ref 100–199)
HDL: 69 mg/dL (ref >39)
LDL Chol Calc (NIH): 132 mg/dL — ABNORMAL HIGH (ref 0–99)
Triglycerides: 82 mg/dL (ref 0–149)
VLDL Cholesterol Cal: 14 mg/dL (ref 5–40)

## 2022-12-22 NOTE — Telephone Encounter (Signed)
As indicated and copied below from assessment and plan Eligha Bridegroom NP had with the pt on 4/10, she advised for the pt to take lisinopril 20 mg po BID.  Will send this dose into the pts pharmacy of choice with reflected instructions.  Called the pts pharmacy [facility] and that updated prescription for lisinopril 20 mg po BID for total of 40 mg daily was sent to them and they confirmed they received and will get this medication ready for the pt.   Left the pt a message to call the office back to endorse lisinopril 20 mg BID script sent into his pharmacy of choice.     Assessment and Plan:       Chest discomfort: He continues to have mild chest discomfort that he describes as "an awareness or a soreness." Reviewed echo from 08/2022, reassurance provided. We discussed various options for home monitoring including Kardia monitor. Advised him of out-of-pocket cost of 14 day Zio monitor. He will consider these options and let us know if he decides to proceed with Zio. He does not want to pursue further testing at this time.    Hypertension: BP is mildly elevated today. Home readings reveal SBP 128-145 mmHg. These are taken prior to medication. I suspect that his BP is well-controlled with medication on board. He will try to take an occasional reading at least 2 hours after p.o. medications. Plan to take amlodipine and lisinopril in divided doses every 12 hours rather than once daily.  Will check renal function and electrolytes today. Continue amlodipine 5 mg twice daily and lisinopril 20 mg twice daily.

## 2022-12-22 NOTE — Telephone Encounter (Signed)
Pt stated that his medication was sent in wrong, that the medication should be lisinopril 20 mg taking BID, total of 40 mg daily. Pt would like a call back concerning this matter. Please address

## 2022-12-22 NOTE — Progress Notes (Signed)
See previous open phone note for further details about lisinopril prescription.

## 2023-01-21 ENCOUNTER — Other Ambulatory Visit: Payer: Self-pay | Admitting: Student

## 2023-02-01 ENCOUNTER — Other Ambulatory Visit: Payer: Self-pay | Admitting: Interventional Cardiology

## 2023-02-01 DIAGNOSIS — R0789 Other chest pain: Secondary | ICD-10-CM

## 2023-02-01 DIAGNOSIS — I10 Essential (primary) hypertension: Secondary | ICD-10-CM

## 2023-02-01 DIAGNOSIS — R001 Bradycardia, unspecified: Secondary | ICD-10-CM

## 2023-02-01 NOTE — Telephone Encounter (Signed)
Filled 12/21/22 with refills

## 2023-03-27 ENCOUNTER — Other Ambulatory Visit: Payer: Self-pay | Admitting: Nurse Practitioner

## 2023-03-27 ENCOUNTER — Other Ambulatory Visit: Payer: Self-pay | Admitting: Student

## 2023-03-27 DIAGNOSIS — R001 Bradycardia, unspecified: Secondary | ICD-10-CM

## 2023-03-27 DIAGNOSIS — I10 Essential (primary) hypertension: Secondary | ICD-10-CM

## 2023-03-27 DIAGNOSIS — R0789 Other chest pain: Secondary | ICD-10-CM

## 2023-08-02 ENCOUNTER — Other Ambulatory Visit: Payer: Self-pay | Admitting: Student

## 2024-02-16 ENCOUNTER — Other Ambulatory Visit: Payer: Self-pay | Admitting: Nurse Practitioner

## 2024-02-16 DIAGNOSIS — R001 Bradycardia, unspecified: Secondary | ICD-10-CM

## 2024-02-16 DIAGNOSIS — I10 Essential (primary) hypertension: Secondary | ICD-10-CM

## 2024-02-16 DIAGNOSIS — R0789 Other chest pain: Secondary | ICD-10-CM

## 2024-04-14 ENCOUNTER — Other Ambulatory Visit: Payer: Self-pay | Admitting: Nurse Practitioner

## 2024-04-14 DIAGNOSIS — I10 Essential (primary) hypertension: Secondary | ICD-10-CM

## 2024-04-14 DIAGNOSIS — R001 Bradycardia, unspecified: Secondary | ICD-10-CM

## 2024-04-14 DIAGNOSIS — R0789 Other chest pain: Secondary | ICD-10-CM

## 2024-05-08 ENCOUNTER — Telehealth: Payer: Self-pay | Admitting: Cardiovascular Disease

## 2024-05-08 ENCOUNTER — Other Ambulatory Visit: Payer: Self-pay | Admitting: Nurse Practitioner

## 2024-05-08 DIAGNOSIS — R001 Bradycardia, unspecified: Secondary | ICD-10-CM

## 2024-05-08 DIAGNOSIS — R0789 Other chest pain: Secondary | ICD-10-CM

## 2024-05-08 DIAGNOSIS — I10 Essential (primary) hypertension: Secondary | ICD-10-CM

## 2024-05-08 MED ORDER — LISINOPRIL 20 MG PO TABS: 20.0000 mg | ORAL_TABLET | Freq: Two times a day (BID) | ORAL | 0 refills | Status: DC

## 2024-05-08 NOTE — Telephone Encounter (Signed)
 Pt of Dr. Verlin. Last seen by Rosaline Bane NP. This was in 12/2022. I called the Pt and he informed me that he was self pay and wasn't coming back in and paying $100 for a visit. Pt also states that at last OV, Dr. Verlin said that his heart is really good for a man his age. Pt said that if he has to come off his meds because he won't come back in, he will.

## 2024-05-08 NOTE — Telephone Encounter (Signed)
 I cannot safely prescribe medication for someone I have not seen in over a year. He will need to get refills from a primary care provider or someone that he establishes himself with.

## 2024-05-08 NOTE — Telephone Encounter (Signed)
*  STAT* If patient is at the pharmacy, call can be transferred to refill team.   1. Which medications need to be refilled? (please list name of each medication and dose if known) amLODipine  (NORVASC ) 5 MG tablet   2. Which pharmacy/location (including street and city if local pharmacy) is medication to be sent to?  [id number] - [place], [place] - [address]      3. Do they need a 30 day or 90 day supply? 90 day    Pt is out of medication

## 2024-05-08 NOTE — Telephone Encounter (Signed)
 Will forward this message to our refill dept, whom refilled the pts lisinopril  today for a 30 day supply.

## 2024-05-08 NOTE — Telephone Encounter (Signed)
 RX sent in on 04/15/24. Other RX ran out and I sent this one in today.

## 2024-05-08 NOTE — Telephone Encounter (Signed)
 Pt c/o medication issue:  1. Name of Medication:   amLODipine  (NORVASC ) 5 MG tablet    lisinopril  (ZESTRIL ) 20 MG tablet    2. How are you currently taking this medication (dosage and times per day)?  TAKE 1 TABLET BY MOUTH TWICE A DAY     TAKE 1 TABLET BY MOUTH TWICE A DAY    3. Are you having a reaction (difficulty breathing--STAT)? No  4. What is your medication issue? Pt is requesting a callback regarding him wanting to discuss the confusion for refills with the two medications, one being filled for 90 days and the other 30 days when he wanted both to be 90 day refill. Please advise

## 2024-06-08 ENCOUNTER — Other Ambulatory Visit: Payer: Self-pay | Admitting: Cardiovascular Disease

## 2024-06-08 DIAGNOSIS — I10 Essential (primary) hypertension: Secondary | ICD-10-CM

## 2024-06-08 DIAGNOSIS — R001 Bradycardia, unspecified: Secondary | ICD-10-CM

## 2024-06-08 DIAGNOSIS — R0789 Other chest pain: Secondary | ICD-10-CM

## 2024-09-25 ENCOUNTER — Ambulatory Visit: Payer: Self-pay | Admitting: Cardiology

## 2024-09-25 VITALS — BP 130/86 | HR 76 | Ht 74.0 in | Wt 197.4 lb

## 2024-09-25 DIAGNOSIS — I351 Nonrheumatic aortic (valve) insufficiency: Secondary | ICD-10-CM

## 2024-09-25 DIAGNOSIS — R0789 Other chest pain: Secondary | ICD-10-CM

## 2024-09-25 DIAGNOSIS — E785 Hyperlipidemia, unspecified: Secondary | ICD-10-CM

## 2024-09-25 DIAGNOSIS — I10 Essential (primary) hypertension: Secondary | ICD-10-CM

## 2024-09-25 DIAGNOSIS — R001 Bradycardia, unspecified: Secondary | ICD-10-CM

## 2024-09-25 MED ORDER — AMLODIPINE BESYLATE 5 MG PO TABS: 5.0000 mg | ORAL_TABLET | Freq: Every day | ORAL | 3 refills | Status: AC

## 2024-09-25 MED ORDER — LISINOPRIL 20 MG PO TABS: 20.0000 mg | ORAL_TABLET | Freq: Two times a day (BID) | ORAL | 3 refills | Status: AC

## 2024-09-25 NOTE — Patient Instructions (Addendum)
 Medication Instructions:  Your physician has recommended you make the following change in your medication:   REDUCE Amlodipine  to 5 mg taking 1 daily  *If you need a refill on your cardiac medications before your next appointment, please call your pharmacy*  Lab Work: None ordered  If you have labs (blood work) drawn today and your tests are completely normal, you will receive your results only by: MyChart Message (if you have MyChart) OR A paper copy in the mail If you have any lab test that is abnormal or we need to change your treatment, we will call you to review the results.  Testing/Procedures: Your physician has requested that you have an echocardiogram. Echocardiography is a painless test that uses sound waves to create images of your heart. It provides your doctor with information about the size and shape of your heart and how well your hearts chambers and valves are working. This procedure takes approximately one hour. There are no restrictions for this procedure. Please do NOT wear cologne, perfume, aftershave, or lotions (deodorant is allowed). Please arrive 15 minutes prior to your appointment time.  Please note: We ask at that you not bring children with you during ultrasound (echo/ vascular) testing. Due to room size and safety concerns, children are not allowed in the ultrasound rooms during exams. Our front office staff cannot provide observation of children in our lobby area while testing is being conducted. An adult accompanying a patient to their appointment will only be allowed in the ultrasound room at the discretion of the ultrasound technician under special circumstances. We apologize for any inconvenience.   Follow-Up: At [facility], you and your health needs are our priority.  As part of our continuing mission to provide you with exceptional heart care, our providers are all part of one team.  This team includes your primary Cardiologist (physician) and  Advanced Practice Providers or APPs (Physician Assistants and Nurse Practitioners) who all work together to provide you with the care you need, when you need it.  Your next appointment:   1 year(s)  Provider:   Lonni Cash, MD or Thom Sluder, PA-C          We recommend signing up for the patient portal called MyChart.  Sign up information is provided on this After Visit Summary.  MyChart is used to connect with patients for Virtual Visits (Telemedicine).  Patients are able to view lab/test results, encounter notes, upcoming appointments, etc.  Non-urgent messages can be sent to your provider as well.   To learn more about what you can do with MyChart, go to [email or web address].   Other Instructions

## 2024-09-25 NOTE — Progress Notes (Signed)
 " Cardiology Office Note:  .   Date:  09/25/2024  ID:  Shane Rojas, DOB [birth year], MRN [id number] PCP: Freddrick Johns  [facility] Providers Cardiologist:  Lonni Cash, MD {  History of Present Illness: .   Shane Rojas is a 64 y.o. male with history of hypertension, aortic insufficiency, hyperlipidemia     Bradycardia 08/2022 noted during admission prompting cardiac consultation.  Heart rates reportedly in the 30s.  Thought to have LOC and requiring atropine/pacing.  No arrhythmias during admission.  Possible vagal response secondary to IV insertion.  EF normal.  Moderate aortic regurgitation.  Hypertension 09/2022 lisinopril  increased to 40 mg 10/2022 amlodipine  5 mg started 12/2022 blood pressure improved 128-145 SBP.  Has reported awareness or a soreness in his chest.  Heart monitor offered, deferred.  Amlodipine  and lisinopril  divided in twice daily dosing.  Social history  Reports family history of father having aortic aneurysm, possible dissection. No history of smoking or drugs. Very active.     Patient with no significant cardiac history.  2023 had an episode of bradycardia that eventually was felt to be vagal response in the setting of EMS/IV insertion.  We have primarily been managing blood pressure.  Last seen 12/2022 and he had reported vague chest discomfort.  Due to lack of insurance he deferred heart monitor.  Today patient presents for annual follow-up and predominantly here because he had to have refills of his medications.  He is doing very well with absolutely no complaints whatsoever.  Going to the gym 4-5 times a week doing swimming, cycling, weight training, yoga.  He has been documenting his blood pressures and overall with well-controlled readings.  He does report that he decreased his amlodipine  to once daily.  Also very interested in getting off of blood pressure medications and things like cayenne and other herbal like remedies.  ROS: Denies:  Chest pain, shortness of breath, orthopnea, peripheral edema, palpitations, syncope, decreased exercise capacity, fatigue, dizziness.   Studies Reviewed: SABRA    EKG Interpretation Date/Time:  Wednesday September 25 2024 15:04:07 EST Ventricular Rate:  76 PR Interval:  140 QRS Duration:  120 QT Interval:  396 QTC Calculation: 445 R Axis:   -9  Text Interpretation: Normal sinus rhythm Right bundle branch block When compared with ECG of 26-Aug-2022 00:38, PREVIOUS ECG IS PRESENT No significant change since last tracing Confirmed by Darryle Currier [phone]) on 09/25/2024 3:18:14 PM    Risk Assessment/Calculations:           Physical Exam:   VS:  BP 130/86   Pulse 76   Ht 6' 2 (1.88 m)   Wt 197 lb 6.4 oz (89.5 kg)   SpO2 100%   BMI 25.34 kg/m    Wt Readings from Last 3 Encounters:  09/25/24 197 lb 6.4 oz (89.5 kg)  12/21/22 195 lb 3.2 oz (88.5 kg)  09/16/22 193 lb 3.2 oz (87.6 kg)    GEN: Well nourished, well developed in no acute distress NECK: No JVD; No carotid bruits CARDIAC: RRR, no murmurs, rubs, gallops RESPIRATORY:  Clear to auscultation without rales, wheezing or rhonchi  ABDOMEN: Soft, non-tender, non-distended EXTREMITIES:  No edema; No deformity   ASSESSMENT AND PLAN: .    Hypertension Blood pressure overall seems well-controlled 130/86 today.  Continue with amlodipine  5 mg daily and lisinopril  20 mg twice daily.  Hyperlipidemia Does not want to be on a statin.  Aortic regurgitation 08/2022 echocardiogram with moderate regurgitation.  Repeat echocardiogram.  Prior note had  commented on possible need for cardiac MRI to evaluate, will check with MD.  Of note does have history of father with aortic aneurysm, question of possible dissection.  Very excellent functional capacity.  Going to the gym 4-5 times a week.  Dispo: 1 year follow-up with Dr. Verlin.  Signed, Thom LITTIE Sluder, PA-C  "

## 2024-09-30 ENCOUNTER — Telehealth (HOSPITAL_COMMUNITY): Payer: Self-pay | Admitting: Cardiology

## 2024-09-30 NOTE — Telephone Encounter (Signed)
 Patient cancelled echocardiogram for reason below: patient is waiting for [facility] to kick in-- he is self pay and will call back . Order will be removed from the echo WQ and when he calls back we will reinstate the order. Thank you.

## 2024-10-28 ENCOUNTER — Ambulatory Visit (HOSPITAL_COMMUNITY): Payer: Self-pay
